# Patient Record
Sex: Female | Born: 1985 | Race: Black or African American | Hispanic: No | Marital: Single | Smoking: Never smoker
Health system: Southern US, Community
[De-identification: ages and names within clinical notes are randomized; demographics above are authoritative.]

## PROBLEM LIST (undated history)

## (undated) DIAGNOSIS — F419 Anxiety disorder, unspecified: Secondary | ICD-10-CM

## (undated) HISTORY — PX: FRACTURE SURGERY: SHX138

---
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
## 2006-01-26 ENCOUNTER — Inpatient Hospital Stay (HOSPITAL_COMMUNITY): Admission: AD | Admit: 2006-01-26 | Discharge: 2006-01-26 | Payer: Self-pay | Admitting: Obstetrics & Gynecology

## 2006-02-07 ENCOUNTER — Inpatient Hospital Stay (HOSPITAL_COMMUNITY): Admission: AD | Admit: 2006-02-07 | Discharge: 2006-02-10 | Payer: Self-pay | Admitting: Obstetrics and Gynecology

## 2006-05-07 ENCOUNTER — Emergency Department (HOSPITAL_COMMUNITY): Admission: EM | Admit: 2006-05-07 | Discharge: 2006-05-08 | Payer: Self-pay | Admitting: Emergency Medicine

## 2006-07-20 ENCOUNTER — Emergency Department (HOSPITAL_COMMUNITY): Admission: EM | Admit: 2006-07-20 | Discharge: 2006-07-20 | Payer: Self-pay | Admitting: Family Medicine

## 2006-11-17 ENCOUNTER — Emergency Department (HOSPITAL_COMMUNITY): Admission: EM | Admit: 2006-11-17 | Discharge: 2006-11-17 | Payer: Self-pay | Admitting: Emergency Medicine

## 2007-04-07 ENCOUNTER — Emergency Department (HOSPITAL_COMMUNITY): Admission: EM | Admit: 2007-04-07 | Discharge: 2007-04-07 | Payer: Self-pay | Admitting: Family Medicine

## 2007-04-17 ENCOUNTER — Emergency Department (HOSPITAL_COMMUNITY): Admission: EM | Admit: 2007-04-17 | Discharge: 2007-04-17 | Payer: Self-pay | Admitting: Family Medicine

## 2007-08-17 ENCOUNTER — Emergency Department (HOSPITAL_COMMUNITY): Admission: EM | Admit: 2007-08-17 | Discharge: 2007-08-17 | Payer: Self-pay | Admitting: Family Medicine

## 2007-10-03 ENCOUNTER — Encounter (INDEPENDENT_AMBULATORY_CARE_PROVIDER_SITE_OTHER): Payer: Self-pay | Admitting: *Deleted

## 2007-10-03 ENCOUNTER — Ambulatory Visit: Payer: Self-pay | Admitting: *Deleted

## 2007-10-13 ENCOUNTER — Emergency Department (HOSPITAL_COMMUNITY): Admission: EM | Admit: 2007-10-13 | Discharge: 2007-10-13 | Payer: Self-pay | Admitting: Emergency Medicine

## 2007-11-03 ENCOUNTER — Inpatient Hospital Stay (HOSPITAL_COMMUNITY): Admission: AD | Admit: 2007-11-03 | Discharge: 2007-11-03 | Payer: Self-pay | Admitting: Obstetrics & Gynecology

## 2007-11-12 ENCOUNTER — Emergency Department (HOSPITAL_COMMUNITY): Admission: EM | Admit: 2007-11-12 | Discharge: 2007-11-12 | Payer: Self-pay | Admitting: Emergency Medicine

## 2008-05-17 ENCOUNTER — Inpatient Hospital Stay (HOSPITAL_COMMUNITY): Admission: AD | Admit: 2008-05-17 | Discharge: 2008-05-17 | Payer: Self-pay | Admitting: Obstetrics & Gynecology

## 2008-07-28 ENCOUNTER — Inpatient Hospital Stay (HOSPITAL_COMMUNITY): Admission: AD | Admit: 2008-07-28 | Discharge: 2008-07-28 | Payer: Self-pay | Admitting: Obstetrics & Gynecology

## 2008-08-03 ENCOUNTER — Emergency Department (HOSPITAL_COMMUNITY): Admission: EM | Admit: 2008-08-03 | Discharge: 2008-08-04 | Payer: Self-pay | Admitting: Emergency Medicine

## 2008-10-05 ENCOUNTER — Emergency Department (HOSPITAL_COMMUNITY): Admission: EM | Admit: 2008-10-05 | Discharge: 2008-10-05 | Payer: Self-pay | Admitting: Family Medicine

## 2008-11-26 ENCOUNTER — Emergency Department (HOSPITAL_COMMUNITY): Admission: EM | Admit: 2008-11-26 | Discharge: 2008-11-26 | Payer: Self-pay | Admitting: Emergency Medicine

## 2009-01-03 ENCOUNTER — Emergency Department (HOSPITAL_COMMUNITY): Admission: EM | Admit: 2009-01-03 | Discharge: 2009-01-03 | Payer: Self-pay | Admitting: Emergency Medicine

## 2009-02-14 ENCOUNTER — Emergency Department (HOSPITAL_COMMUNITY): Admission: EM | Admit: 2009-02-14 | Discharge: 2009-02-14 | Payer: Self-pay | Admitting: Emergency Medicine

## 2009-09-10 ENCOUNTER — Emergency Department (HOSPITAL_COMMUNITY): Admission: EM | Admit: 2009-09-10 | Discharge: 2009-09-10 | Payer: Self-pay | Admitting: Emergency Medicine

## 2009-10-04 ENCOUNTER — Emergency Department (HOSPITAL_COMMUNITY): Admission: EM | Admit: 2009-10-04 | Discharge: 2009-10-04 | Payer: Self-pay | Admitting: Emergency Medicine

## 2009-10-09 ENCOUNTER — Emergency Department (HOSPITAL_COMMUNITY): Admission: EM | Admit: 2009-10-09 | Discharge: 2009-10-09 | Payer: Self-pay | Admitting: Emergency Medicine

## 2009-10-21 ENCOUNTER — Emergency Department (HOSPITAL_COMMUNITY): Admission: EM | Admit: 2009-10-21 | Discharge: 2009-10-21 | Payer: Self-pay | Admitting: Emergency Medicine

## 2010-02-15 ENCOUNTER — Emergency Department (HOSPITAL_COMMUNITY): Admission: EM | Admit: 2010-02-15 | Discharge: 2010-02-16 | Payer: Self-pay | Admitting: Emergency Medicine

## 2010-12-02 IMAGING — CR DG KNEE COMPLETE 4+V*L*
4 series · 4 of 4 positions shown · non-contrast
Comparison: None

CLINICAL DATA: Trauma

LEFT KNEE - COMPLETE 4+ VIEW

[t knee ap left]
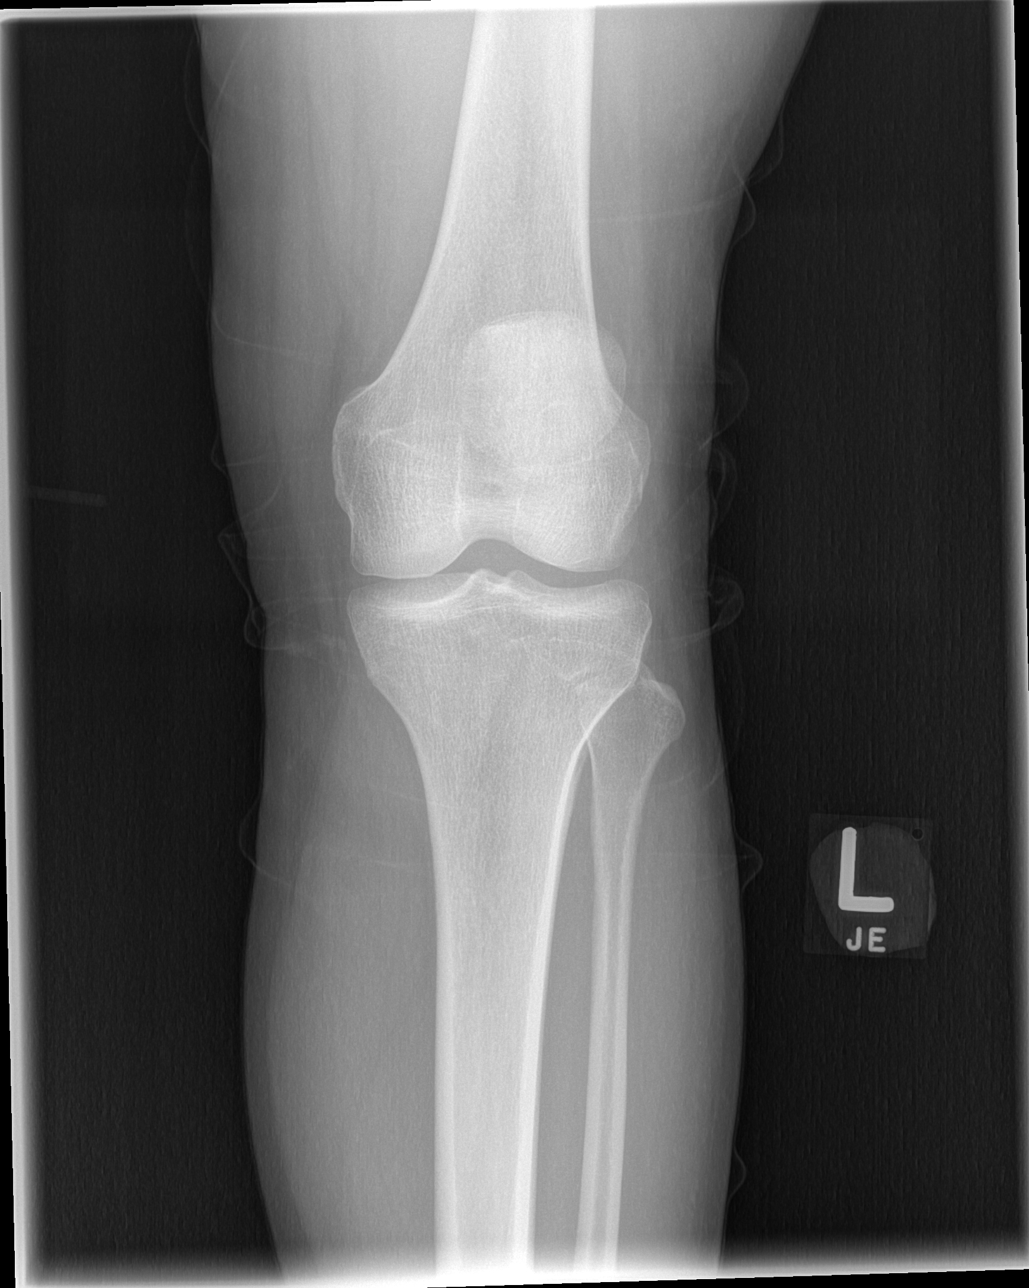

[t knee oblique left (1 of 2)]
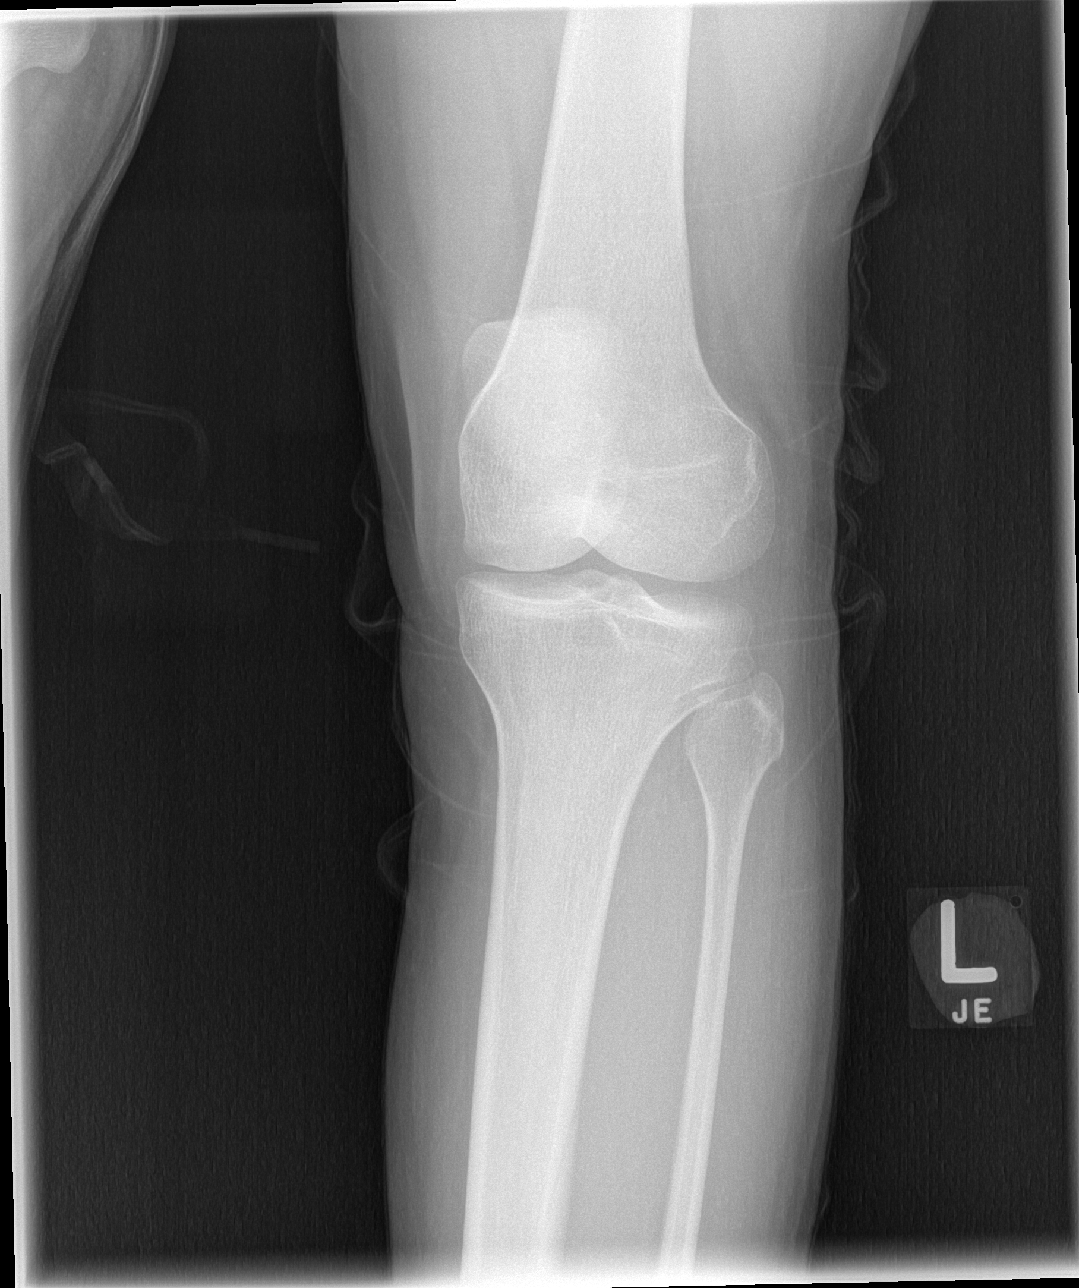

[t knee oblique left (2 of 2)]
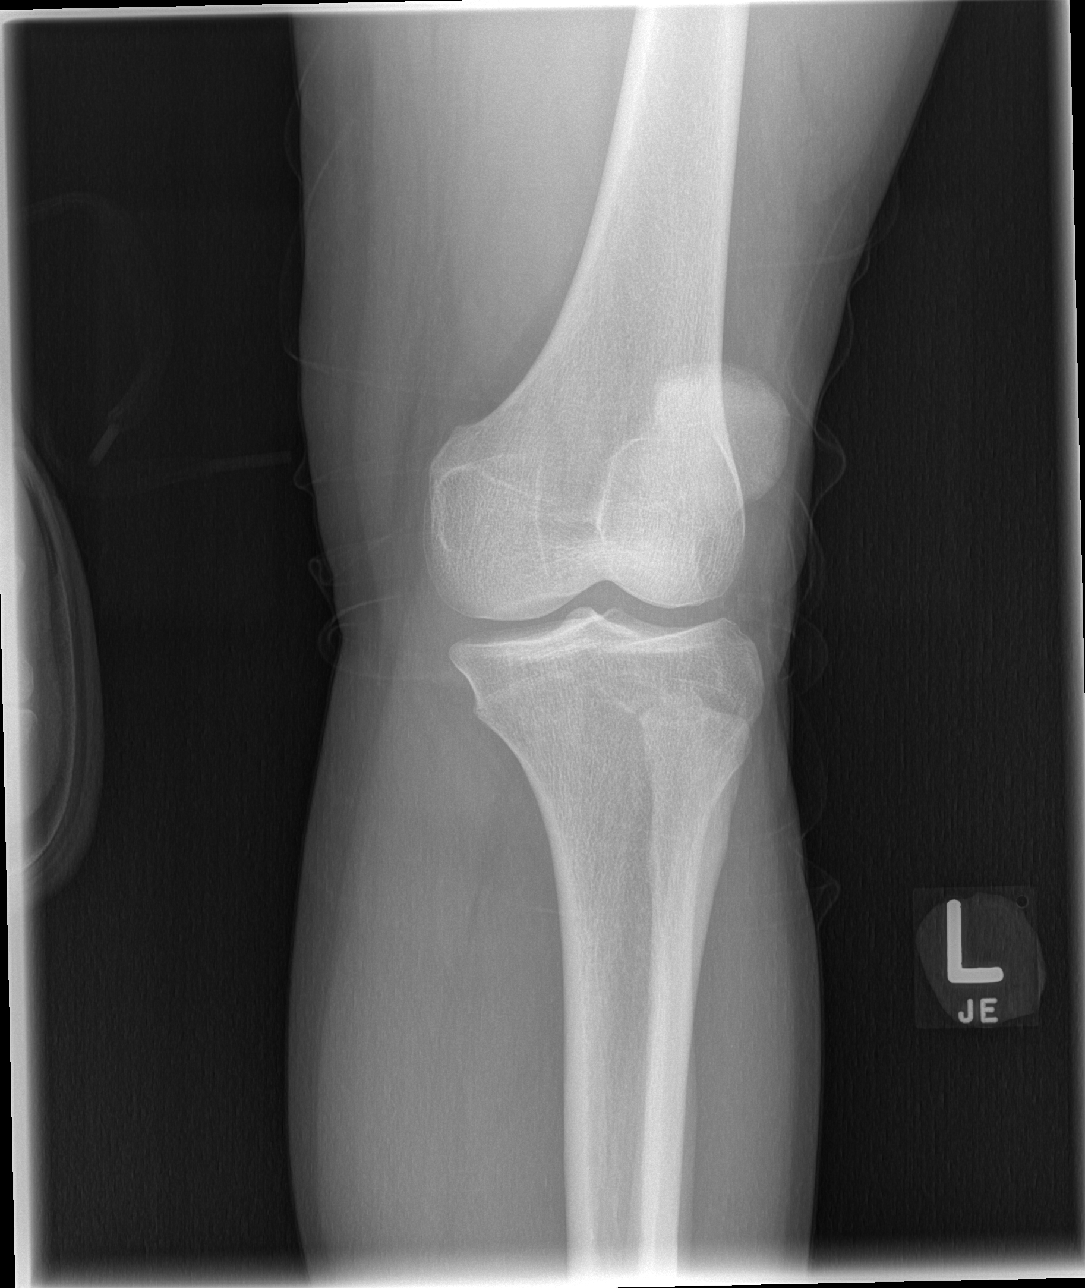

[t knee lat left]
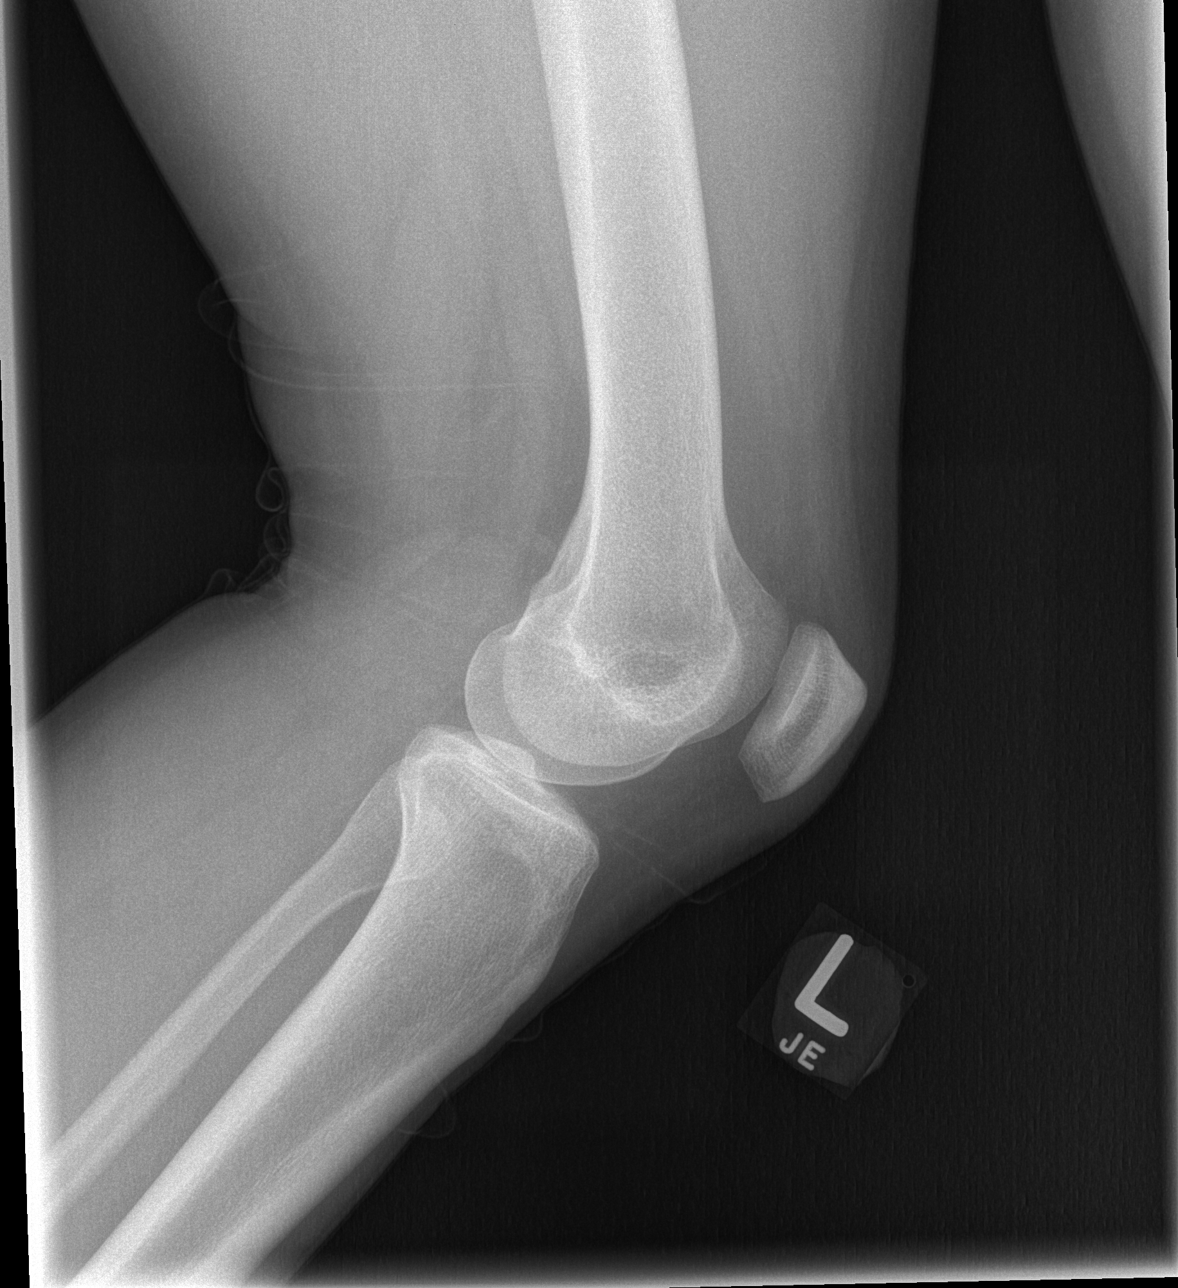

[4 of 4 positions shown; findings below may reference images not displayed]

FINDINGS: Anatomic alignment.  No joint effusion.  No fracture.
IMPRESSION: Negative for fracture.

## 2011-02-11 LAB — URINALYSIS, ROUTINE W REFLEX MICROSCOPIC
Hgb urine dipstick: NEGATIVE
Nitrite: NEGATIVE
Protein, ur: NEGATIVE mg/dL

## 2011-02-11 LAB — GC/CHLAMYDIA PROBE AMP, GENITAL: Chlamydia, DNA Probe: NEGATIVE

## 2011-02-11 LAB — POCT I-STAT, CHEM 8
Calcium, Ion: 1.24 mmol/L (ref 1.12–1.32)
Chloride: 106 mEq/L (ref 96–112)
Potassium: 4.2 mEq/L (ref 3.5–5.1)
Sodium: 139 mEq/L (ref 135–145)

## 2011-02-11 LAB — WET PREP, GENITAL: Yeast Wet Prep HPF POC: NONE SEEN

## 2011-02-21 LAB — URINALYSIS, ROUTINE W REFLEX MICROSCOPIC
Bilirubin Urine: NEGATIVE
Glucose, UA: NEGATIVE mg/dL
Specific Gravity, Urine: 1.025 (ref 1.005–1.030)

## 2011-02-21 LAB — URINE MICROSCOPIC-ADD ON

## 2011-02-22 LAB — WET PREP, GENITAL

## 2011-02-22 LAB — URINALYSIS, ROUTINE W REFLEX MICROSCOPIC
Glucose, UA: NEGATIVE mg/dL
Ketones, ur: NEGATIVE mg/dL
Nitrite: NEGATIVE

## 2011-02-22 LAB — URINE CULTURE: Colony Count: 60000

## 2011-04-03 NOTE — Group Therapy Note (Signed)
Christina Gamble, Christina Gamble               ACCOUNT NO.:  [id number]   MEDICAL RECORD NO.:  [id number]          PATIENT TYPE:  WOC   LOCATION:  [facility]                   FACILITY:  [facility]   PHYSICIAN:  Karlton Lemon, MD      DATE OF BIRTH:  09-18-1986   DATE OF SERVICE:  10/03/2007                                  CLINIC NOTE   CHIEF COMPLAINT:  Abnormal Pap smear.   HISTORY OF PRESENT ILLNESS:  This is a 25 year old female with a history  of abnormal Pap smear and colposcopy x1.  Her initial colposcopy was  done for a ASC-H Pap smear.  Since her colposcopy, she has had 2 Pap  smears, one being ASCUS, and one being LSIL.  She was referred today for  colposcopy.  The patient states she also feels like she is having some  discomfort when urinating but is otherwise doing well.   PAST MEDICAL HISTORY:  Abnormal Pap smears as stated above.   MEDICATIONS:  Multivitamin, Tylenol p.r.n.   PHYSICAL EXAMINATION:  GENERAL:  Well-appearing female in no distress.  VITAL SIGNS:  Temperature 98.1, respirations 16, blood pressure 83/56.  GENITOURINARY:  Normal external female genitalia.  Vaginal mucosa is  pink and moist.  There is a small amount of whitish discharge.  A Pap  smear is performed.  The cervix is visualized as well.  Bimanual was  performed without cervical motion tenderness.  Uterus is midline, normal  size.  Ovaries are not palpated today.   IMPRESSION AND PLAN:  A 25 year old [place] American female with  abnormal Pap smears.  The patient was discussed with Dr. Okey Dupre and will  not perform colposcopy today due to low grade squamous lesion at age 25.  The chance of this continuing to improve from her previous history of  favor high grade squamous epithelium is possible.  We will perform a Pap  smear today including gonorrhea and chlamydia.  Urinalysis today was  negative.  We will follow in 6 months for repeat Pap smear, unless Pap  smear today shows high grade abnormality.     ______________________________  Karlton Lemon, MD     NS/MEDQ  D:  10/03/2007  T:  10/03/2007  Job:  [id number]

## 2011-08-16 LAB — URINE MICROSCOPIC-ADD ON

## 2011-08-16 LAB — CBC
MCHC: 32.5
MCV: 72.4 — ABNORMAL LOW
Platelets: 335
RDW: 16.1 — ABNORMAL HIGH
WBC: 6.2

## 2011-08-16 LAB — URINALYSIS, ROUTINE W REFLEX MICROSCOPIC
Nitrite: NEGATIVE
Specific Gravity, Urine: >1.03 — ABNORMAL HIGH
Urobilinogen, UA: 0.2

## 2011-08-16 LAB — POCT PREGNANCY, URINE: Operator id: 181461

## 2011-08-16 LAB — WET PREP, GENITAL
Trich, Wet Prep: NONE SEEN
Yeast Wet Prep HPF POC: NONE SEEN

## 2011-08-16 LAB — GC/CHLAMYDIA PROBE AMP, GENITAL: Chlamydia, DNA Probe: NEGATIVE

## 2011-08-21 LAB — POCT URINALYSIS DIP (DEVICE)
Nitrite: POSITIVE — AB
Protein, ur: 100 mg/dL — AB
Urobilinogen, UA: 1 mg/dL (ref 0.0–1.0)

## 2011-08-21 LAB — WET PREP, GENITAL: Yeast Wet Prep HPF POC: NONE SEEN

## 2011-08-21 LAB — POCT PREGNANCY, URINE: Preg Test, Ur: NEGATIVE

## 2011-08-22 LAB — URINALYSIS, ROUTINE W REFLEX MICROSCOPIC
Glucose, UA: NEGATIVE
Hgb urine dipstick: NEGATIVE
Specific Gravity, Urine: 1.02

## 2011-08-22 LAB — HCG, QUANTITATIVE, PREGNANCY: hCG, Beta Chain, Quant, S: 72105 — ABNORMAL HIGH

## 2011-08-22 LAB — CBC
HCT: 36.1
MCV: 72.6 — ABNORMAL LOW
RBC: 4.98
WBC: 9.6

## 2011-08-22 LAB — GC/CHLAMYDIA PROBE AMP, GENITAL: Chlamydia, DNA Probe: NEGATIVE

## 2011-08-22 LAB — WET PREP, GENITAL
Trich, Wet Prep: NONE SEEN
Yeast Wet Prep HPF POC: NONE SEEN

## 2011-08-24 LAB — GC/CHLAMYDIA PROBE AMP, GENITAL
Chlamydia, DNA Probe: NEGATIVE
GC Probe Amp, Genital: NEGATIVE

## 2011-08-24 LAB — WET PREP, GENITAL: Trich, Wet Prep: NONE SEEN

## 2011-08-24 LAB — RAPID STREP SCREEN (MED CTR MEBANE ONLY): Streptococcus, Group A Screen (Direct): NEGATIVE

## 2011-08-24 LAB — POCT PREGNANCY, URINE: Preg Test, Ur: NEGATIVE

## 2011-08-28 LAB — POCT URINALYSIS DIP (DEVICE)
Bilirubin Urine: NEGATIVE
Ketones, ur: NEGATIVE
Operator id: 297281
Protein, ur: NEGATIVE
Specific Gravity, Urine: 1.02

## 2011-08-28 LAB — STREP A DNA PROBE

## 2011-08-28 LAB — POCT PREGNANCY, URINE
Operator id: 297281
Preg Test, Ur: NEGATIVE

## 2011-08-30 LAB — POCT PREGNANCY, URINE
Operator id: 116391
Preg Test, Ur: NEGATIVE

## 2014-10-30 ENCOUNTER — Emergency Department (HOSPITAL_BASED_OUTPATIENT_CLINIC_OR_DEPARTMENT_OTHER)
Admission: EM | Admit: 2014-10-30 | Discharge: 2014-10-30 | Disposition: A | Discharge status: Home / Self Care | Payer: Self-pay | Attending: Emergency Medicine | Admitting: Emergency Medicine

## 2014-10-30 ENCOUNTER — Encounter (HOSPITAL_BASED_OUTPATIENT_CLINIC_OR_DEPARTMENT_OTHER): Payer: Self-pay | Admitting: *Deleted

## 2014-10-30 ENCOUNTER — Emergency Department (HOSPITAL_BASED_OUTPATIENT_CLINIC_OR_DEPARTMENT_OTHER): Payer: Self-pay

## 2014-10-30 DIAGNOSIS — M940 Chondrocostal junction syndrome [Tietze]: Secondary | ICD-10-CM | POA: Insufficient documentation

## 2014-10-30 DIAGNOSIS — R0981 Nasal congestion: Secondary | ICD-10-CM | POA: Insufficient documentation

## 2014-10-30 DIAGNOSIS — R079 Chest pain, unspecified: Secondary | ICD-10-CM | POA: Insufficient documentation

## 2014-10-30 MED ORDER — NAPROXEN 500 MG PO TABS: 500.0000 mg | ORAL_TABLET | Freq: Two times a day (BID) | ORAL | Status: DC

## 2014-10-30 NOTE — ED Notes (Signed)
Pt reports SOB and generalized weakness over the last week worse today. Also reports burning sensation in chest and nasal congestion but denies cough or sore throat.

## 2014-10-30 NOTE — ED Notes (Signed)
Patient states she has central chest pain and shortness of breath. States the pain started last week. Patient texting during triage.

## 2014-10-30 NOTE — ED Provider Notes (Signed)
CSN: [id number]     Arrival date & time 10/30/14  1839 History   First MD Initiated Contact with Patient 10/30/14 1921     Chief Complaint  Patient presents with  . Shortness of Breath     (Consider location/radiation/quality/duration/timing/severity/associated sxs/prior Treatment) HPI Comments: 28 year old healthy female presenting with chest pain 1 week. Patient describes the pain as constant, radiating across her chest. No aggravating or alleviating factors. Pain slightly worse today. Admits to mild shortness of breath and a burning sensation in her chest with associated nasal congestion. She has been taking over-the-counter TheraFlu with minimal relief. States she started to feel fatigued, weak and achy. She endorses chills. Unknown fever. No nausea or vomiting. Denies cough, sore throat or wheezing. Nonsmoker. Denies family history of early heart disease.  Patient is a 28 y.o. female presenting with shortness of breath. The history is provided by the patient.  Shortness of Breath   History reviewed. No pertinent past medical history. History reviewed. No pertinent past surgical history. No family history on file. History  Substance Use Topics  . Smoking status: Never Smoker   . Smokeless tobacco: Not on file  . Alcohol Use: No   OB History    No data available     Review of Systems  10 Systems reviewed and are negative for acute change except as noted in the HPI.  Allergies  Review of patient's allergies indicates no known allergies.  Home Medications   Prior to Admission medications   Medication Sig Start Date End Date Taking? Authorizing Provider  naproxen (NAPROSYN) 500 MG tablet Take 1 tablet (500 mg total) by mouth 2 (two) times daily. 10/30/14   Zuleyma Scharf M Susumu Hackler, PA-C   BP 109/62 mmHg  Pulse 94  Temp(Src) 98.9 F (37.2 C) (Oral)  Resp 18  Ht 4\' 11"  (1.499 m)  Wt 120 lb (54.432 kg)  BMI 24.22 kg/m2  SpO2 100% Physical Exam  Constitutional: She is oriented  to person, place, and time. She appears well-developed and well-nourished. No distress.  HENT:  Head: Normocephalic and atraumatic.  Mouth/Throat: Oropharynx is clear and moist.  Eyes: Conjunctivae and EOM are normal. Pupils are equal, round, and reactive to light.  Neck: Normal range of motion. Neck supple. No JVD present.  Cardiovascular: Normal rate, regular rhythm, normal heart sounds and intact distal pulses.   No extremity edema.  Pulmonary/Chest: Effort normal and breath sounds normal. No respiratory distress. She exhibits tenderness (mild tenderness across chest).  Abdominal: Soft. Bowel sounds are normal. There is no tenderness.  Musculoskeletal: Normal range of motion. She exhibits no edema.  Neurological: She is alert and oriented to person, place, and time. She has normal strength. No sensory deficit.  Speech fluent, goal oriented. Moves limbs without ataxia. Equal grip strength bilateral.  Skin: Skin is warm and dry. No rash noted. She is not diaphoretic.  Psychiatric: She has a normal mood and affect. Her behavior is normal.  Nursing note and vitals reviewed.   ED Course  Procedures (including critical care time) Labs Review Labs Reviewed - No data to display  Imaging Review Dg Chest 2 View  10/30/2014   CLINICAL DATA:  Shortness of breath and weakness  EXAM: CHEST  2 VIEW  COMPARISON:  None.  FINDINGS: The heart size and mediastinal contours are within normal limits. Both lungs are clear. The visualized skeletal structures are unremarkable.  IMPRESSION: No active cardiopulmonary disease.   Electronically Signed   By: Eulah PontMark  Lukens M.D.  On: 10/30/2014 20:15     EKG Interpretation None      MDM   Final diagnoses:  Chest pain  Costochondritis   Patient in no apparent distress. Vital signs stable. Viral type symptoms present. Afebrile. I do not feel cardiac workup is necessary, low risk. Doubt PE, PERC negative. EKG without any acute findings. Chest x-ray  negative. Patient was looking down on her phone throughout most of this encounter, along with immediately requesting the TV put back on that was shut off while laughing asking to be turned back on. Discussed symptomatic treatment. She is stable for discharge. Return precautions given. Patient states understanding of treatment care plan and is agreeable.  Kathrynn SpeedRobyn M Jeyla Bulger, PA-C 10/30/14 2029  Warnell Foresterrey Wofford, MD 10/31/14 [phone]

## 2014-10-30 NOTE — Discharge Instructions (Signed)
Naproxen as prescribed as needed for pain. You may apply an ice pack or heating pad across her chest. Rest and stay well-hydrated.  Costochondritis Costochondritis, sometimes called Tietze syndrome, is a swelling and irritation (inflammation) of the tissue (cartilage) that connects your ribs with your breastbone (sternum). It causes pain in the chest and rib area. Costochondritis usually goes away on its own over time. It can take up to 6 weeks or longer to get better, especially if you are unable to limit your activities. CAUSES  Some cases of costochondritis have no known cause. Possible causes include:  Injury (trauma).  Exercise or activity such as lifting.  Severe coughing. SIGNS AND SYMPTOMS  Pain and tenderness in the chest and rib area.  Pain that gets worse when coughing or taking deep breaths.  Pain that gets worse with specific movements. DIAGNOSIS  Your health care provider will do a physical exam and ask about your symptoms. Chest X-rays or other tests may be done to rule out other problems. TREATMENT  Costochondritis usually goes away on its own over time. Your health care provider may prescribe medicine to help relieve pain. HOME CARE INSTRUCTIONS   Avoid exhausting physical activity. Try not to strain your ribs during normal activity. This would include any activities using chest, abdominal, and side muscles, especially if heavy weights are used.  Apply ice to the affected area for the first 2 days after the pain begins.  Put ice in a plastic bag.  Place a towel between your skin and the bag.  Leave the ice on for 20 minutes, 2-3 times a day.  Only take over-the-counter or prescription medicines as directed by your health care provider. SEEK MEDICAL CARE IF:  You have redness or swelling at the rib joints. These are signs of infection.  Your pain does not go away despite rest or medicine. SEEK IMMEDIATE MEDICAL CARE IF:   Your pain increases or you are very  uncomfortable.  You have shortness of breath or difficulty breathing.  You cough up blood.  You have worse chest pains, sweating, or vomiting.  You have a fever or persistent symptoms for more than 2-3 days.  You have a fever and your symptoms suddenly get worse. MAKE SURE YOU:   Understand these instructions.  Will watch your condition.  Will get help right away if you are not doing well or get worse. Document Released: 08/15/2005 Document Revised: 08/26/2013 Document Reviewed: 06/09/2013 [facility] Patient Information 2015 [place], [place]. This information is not intended to replace advice given to you by your health care provider. Make sure you discuss any questions you have with your health care provider.  Chest Pain (Nonspecific) It is often hard to give a specific diagnosis for the cause of chest pain. There is always a chance that your pain could be related to something serious, such as a heart attack or a blood clot in the lungs. You need to follow up with your health care provider for further evaluation. CAUSES   Heartburn.  Pneumonia or bronchitis.  Anxiety or stress.  Inflammation around your heart (pericarditis) or lung (pleuritis or pleurisy).  A blood clot in the lung.  A collapsed lung (pneumothorax). It can develop suddenly on its own (spontaneous pneumothorax) or from trauma to the chest.  Shingles infection (herpes zoster virus). The chest wall is composed of bones, muscles, and cartilage. Any of these can be the source of the pain.  The bones can be bruised by injury.  The muscles  or cartilage can be strained by coughing or overwork.  The cartilage can be affected by inflammation and become sore (costochondritis). DIAGNOSIS  Lab tests or other studies may be needed to find the cause of your pain. Your health care provider may have you take a test called an ambulatory electrocardiogram (ECG). An ECG records your heartbeat patterns over a 24-hour period.  You may also have other tests, such as:  Transthoracic echocardiogram (TTE). During echocardiography, sound waves are used to evaluate how blood flows through your heart.  Transesophageal echocardiogram (TEE).  Cardiac monitoring. This allows your health care provider to monitor your heart rate and rhythm in real time.  Holter monitor. This is a portable device that records your heartbeat and can help diagnose heart arrhythmias. It allows your health care provider to track your heart activity for several days, if needed.  Stress tests by exercise or by giving medicine that makes the heart beat faster. TREATMENT   Treatment depends on what may be causing your chest pain. Treatment may include:  Acid blockers for heartburn.  Anti-inflammatory medicine.  Pain medicine for inflammatory conditions.  Antibiotics if an infection is present.  You may be advised to change lifestyle habits. This includes stopping smoking and avoiding alcohol, caffeine, and chocolate.  You may be advised to keep your head raised (elevated) when sleeping. This reduces the chance of acid going backward from your stomach into your esophagus. Most of the time, nonspecific chest pain will improve within 2-3 days with rest and mild pain medicine.  HOME CARE INSTRUCTIONS   If antibiotics were prescribed, take them as directed. Finish them even if you start to feel better.  For the next few days, avoid physical activities that bring on chest pain. Continue physical activities as directed.  Do not use any tobacco products, including cigarettes, chewing tobacco, or electronic cigarettes.  Avoid drinking alcohol.  Only take medicine as directed by your health care provider.  Follow your health care provider's suggestions for further testing if your chest pain does not go away.  Keep any follow-up appointments you made. If you do not go to an appointment, you could develop lasting (chronic) problems with pain. If  there is any problem keeping an appointment, call to reschedule. SEEK MEDICAL CARE IF:   Your chest pain does not go away, even after treatment.  You have a rash with blisters on your chest.  You have a fever. SEEK IMMEDIATE MEDICAL CARE IF:   You have increased chest pain or pain that spreads to your arm, neck, jaw, back, or abdomen.  You have shortness of breath.  You have an increasing cough, or you cough up blood.  You have severe back or abdominal pain.  You feel nauseous or vomit.  You have severe weakness.  You faint.  You have chills. This is an emergency. Do not wait to see if the pain will go away. Get medical help at once. Call your local emergency services (911 in [place] Do not drive yourself to the hospital. MAKE SURE YOU:   Understand these instructions.  Will watch your condition.  Will get help right away if you are not doing well or get worse. Document Released: 08/15/2005 Document Revised: 11/10/2013 Document Reviewed: 06/10/2008 [facility] Patient Information 2015 [place]Care, [place]LL[place]. This information is not intended to replace advice given to you by your health care provider. Make sure you discuss any questions you have with your health care provider.

## 2014-11-27 ENCOUNTER — Emergency Department (HOSPITAL_BASED_OUTPATIENT_CLINIC_OR_DEPARTMENT_OTHER)
Admission: EM | Admit: 2014-11-27 | Discharge: 2014-11-27 | Disposition: A | Discharge status: Home / Self Care | Payer: Self-pay | Attending: Emergency Medicine | Admitting: Emergency Medicine

## 2014-11-27 ENCOUNTER — Emergency Department (HOSPITAL_BASED_OUTPATIENT_CLINIC_OR_DEPARTMENT_OTHER): Payer: Self-pay

## 2014-11-27 ENCOUNTER — Encounter (HOSPITAL_BASED_OUTPATIENT_CLINIC_OR_DEPARTMENT_OTHER): Payer: Self-pay | Admitting: Emergency Medicine

## 2014-11-27 DIAGNOSIS — Z23 Encounter for immunization: Secondary | ICD-10-CM | POA: Insufficient documentation

## 2014-11-27 DIAGNOSIS — S61211A Laceration without foreign body of left index finger without damage to nail, initial encounter: Secondary | ICD-10-CM | POA: Insufficient documentation

## 2014-11-27 DIAGNOSIS — W272XXA Contact with scissors, initial encounter: Secondary | ICD-10-CM | POA: Insufficient documentation

## 2014-11-27 DIAGNOSIS — Y9289 Other specified places as the place of occurrence of the external cause: Secondary | ICD-10-CM | POA: Insufficient documentation

## 2014-11-27 DIAGNOSIS — Z791 Long term (current) use of non-steroidal anti-inflammatories (NSAID): Secondary | ICD-10-CM | POA: Insufficient documentation

## 2014-11-27 DIAGNOSIS — Y9389 Activity, other specified: Secondary | ICD-10-CM | POA: Insufficient documentation

## 2014-11-27 DIAGNOSIS — Y998 Other external cause status: Secondary | ICD-10-CM | POA: Insufficient documentation

## 2014-11-27 DIAGNOSIS — S61219A Laceration without foreign body of unspecified finger without damage to nail, initial encounter: Secondary | ICD-10-CM

## 2014-11-27 MED ORDER — TETANUS-DIPHTH-ACELL PERTUSSIS 5-2.5-18.5 LF-MCG/0.5 IM SUSP
0.5000 mL | Freq: Once | INTRAMUSCULAR | Status: AC
  Administered 2014-11-27: 0.5 mL via INTRAMUSCULAR
  Filled 2014-11-27: qty 0.5

## 2014-11-27 NOTE — ED Notes (Signed)
Pt presents to ED with complaints of lac to index finger on left hand with scissors.

## 2014-11-27 NOTE — ED Provider Notes (Signed)
CSN: [id number]     Arrival date & time 11/27/14  1830 History   First MD Initiated Contact with Patient 11/27/14 1912     Chief Complaint  Patient presents with  . Laceration     (Consider location/radiation/quality/duration/timing/severity/associated sxs/prior Treatment) HPI   Christina Gamble is a 29 y.o. female complaining of laceration to left index finger after she cut herself with scissors just prior to arrival. Patient is unsure when her last tetanus shot was, bleeding is controlled. Patient states pain is minimal, 2 out of 10 and exacerbated by movement and palpation. Patient is right-hand-dominant. She denies numbness, weakness, decreased range of motion.  History reviewed. No pertinent past medical history. History reviewed. No pertinent past surgical history. No family history on file. History  Substance Use Topics  . Smoking status: Never Smoker   . Smokeless tobacco: Not on file  . Alcohol Use: No   OB History    No data available     Review of Systems  10 systems reviewed and found to be negative, except as noted in the HPI.  Allergies  Review of patient's allergies indicates no known allergies.  Home Medications   Prior to Admission medications   Medication Sig Start Date End Date Taking? Authorizing Provider  naproxen (NAPROSYN) 500 MG tablet Take 1 tablet (500 mg total) by mouth 2 (two) times daily. 10/30/14   Robyn M Hess, PA-C   BP 113/53 mmHg  Pulse 78  Temp(Src) 98.7 F (37.1 C) (Oral)  Resp 18  Wt 120 lb (54.432 kg)  SpO2 100%  LMP 11/27/2014 Physical Exam  Skin:  1 cm irregular full and partial-thickness laceration to distal tip of volar side of left pointer finger, full range of motion to DIP in flexion and extension.    ED Course  LACERATION REPAIR Date/Time: 11/27/2014 7:40 PM Performed by: Wynetta EmeryPISCIOTTA, Erland Vivas Authorized by: Wynetta EmeryPISCIOTTA, [employer] Consent: Verbal consent obtained. Risks and benefits: risks, benefits and alternatives were  discussed Consent given by: patient Required items: required blood products, implants, devices, and special equipment available Patient identity confirmed: verbally with patient Time out: Immediately prior to procedure a "time out" was called to verify the correct patient, procedure, equipment, support staff and site/side marked as required. Body area: upper extremity Location details: left index finger Laceration length: 1 cm Foreign bodies: no foreign bodies Vascular damage: no Patient sedated: no Preparation: Patient was prepped and draped in the usual sterile fashion. Irrigation solution: saline Irrigation method: syringe Amount of cleaning: extensive Debridement: moderate Degree of undermining: none Skin closure: glue Approximation: close Approximation difficulty: complex Patient tolerance: Patient tolerated the procedure well with no immediate complications   (including critical care time) Labs Review Labs Reviewed - No data to display  Imaging Review No results found.   EKG Interpretation None      MDM   Final diagnoses:  Rash and nonspecific skin eruption  Cyst of right Bartholin's gland    Filed Vitals:   11/27/14 1843 11/27/14 2008  BP: 113/53 100/52  Pulse: 78 84  Temp: 98.7 F (37.1 C)   TempSrc: Oral   Resp: 18 18  Weight: 120 lb (54.432 kg)   SpO2: 100% 97%    Medications  Tdap (BOOSTRIX) injection 0.5 mL (0.5 mLs Intramuscular Given 11/27/14 1930)    Christina Gamble is a pleasant 29 y.o. female presenting with finger laceration.  No signs of tendon/joint involvement. Tdap booster given. Pressure irrigation performed. Laceration occurred < 8 hours prior to repair  which was well tolerated. Pt has no co morbidities to effect normal wound healing. Discussed suture home care w pt and answered questions. Pt to f-u for wound check and suture removal in 7 days. Pt is hemodynamically stable with no complaints prior to dc.   Evaluation does not show  pathology that would require ongoing emergent intervention or inpatient treatment. Pt is hemodynamically stable and mentating appropriately. Discussed findings and plan with patient/guardian, who agrees with care plan. All questions answered. Return precautions discussed and outpatient follow up given.    Wynetta Emery, PA-C 11/27/14 [id number]  Gilda Crease, MD 11/28/14 [phone]

## 2014-11-27 NOTE — Discharge Instructions (Signed)
°  Keep wound dry and do not remove dressing for 24 hours if possible. After that, wash gently morning and night (every 12 hours) with soap and water.    Do NOT use rubbing alcohol or hydrogen peroxide, do not soak the area   Every attempt was made to remove foreign body (contaminants) from the wound.  However, there is always a chance that some may remain in the wound. This can  increase your risk of infection.   If you see signs of infection (warmth, redness, tenderness, pus, sharp increase in pain, fever, red streaking in the skin) immediately return to the emergency department.   After the wound heals fully, apply sunscreen for 6-12 months to minimize scarring.

## 2015-05-11 ENCOUNTER — Encounter (HOSPITAL_BASED_OUTPATIENT_CLINIC_OR_DEPARTMENT_OTHER): Payer: Self-pay | Admitting: Emergency Medicine

## 2015-05-11 ENCOUNTER — Emergency Department (HOSPITAL_BASED_OUTPATIENT_CLINIC_OR_DEPARTMENT_OTHER)
Admission: EM | Admit: 2015-05-11 | Discharge: 2015-05-11 | Disposition: A | Discharge status: Home / Self Care | Payer: Medicaid Other | Attending: Emergency Medicine | Admitting: Emergency Medicine

## 2015-05-11 DIAGNOSIS — L739 Follicular disorder, unspecified: Secondary | ICD-10-CM | POA: Insufficient documentation

## 2015-05-11 DIAGNOSIS — Z791 Long term (current) use of non-steroidal anti-inflammatories (NSAID): Secondary | ICD-10-CM | POA: Insufficient documentation

## 2015-05-11 NOTE — ED Notes (Addendum)
29 yo with c/o vaginal irritation with raised bump. States its a irritated hair bump, since yesterday. Denies d/c or bleeding.

## 2015-05-11 NOTE — ED Provider Notes (Signed)
CSN: [id number]     Arrival date & time 05/11/15  2022 History  This chart was scribed for Rolan Bucco, MD by Phillis Haggis, ED Scribe. This patient was seen in room MH04/MH04 and patient care was started at 9:33 PM.    Chief Complaint  Patient presents with  . Groin Swelling   The history is provided by the patient. No language interpreter was used.    HPI Comments: Christina Gamble is a 29 y.o. female who presents to the Emergency Department complaining of vaginal irritation onset one day ago. She states that she has a non-painful bump in her groin region with some swelling. She states that she had an appointment recently where she got tested for HIV/Aids and other STDs and all was negative. She states that she is sexually active with the same partner; states that she had sex a week ago and may have had a tear. She denies abdominal pain, fever, chills, urinary symptoms, vaginal bleeding or discharge. She is worried about Herpes infection.  History reviewed. No pertinent past medical history. Past Surgical History  Procedure Laterality Date  . Fracture surgery     No family history on file. History  Substance Use Topics  . Smoking status: Never Smoker   . Smokeless tobacco: Not on file  . Alcohol Use: Yes   OB History    No data available     Review of Systems  Constitutional: Negative for fever, chills, diaphoresis and fatigue.  HENT: Negative for congestion, rhinorrhea and sneezing.   Eyes: Negative.   Respiratory: Negative for cough, chest tightness and shortness of breath.   Cardiovascular: Negative for chest pain and leg swelling.  Gastrointestinal: Negative for nausea, vomiting, abdominal pain, diarrhea and blood in stool.  Genitourinary: Negative for dysuria, frequency, hematuria, flank pain, vaginal bleeding, vaginal discharge, difficulty urinating and vaginal pain.  Musculoskeletal: Negative for back pain and arthralgias.  Skin: Negative for rash.       Raised bump  near vagina  Neurological: Negative for dizziness, speech difficulty, weakness, numbness and headaches.   Allergies  Review of patient's allergies indicates no known allergies.  Home Medications   Prior to Admission medications   Medication Sig Start Date End Date Taking? Authorizing Provider  naproxen (NAPROSYN) 500 MG tablet Take 1 tablet (500 mg total) by mouth 2 (two) times daily. 10/30/14   Robyn M Hess, PA-C   BP 90/57 mmHg  Pulse 66  Temp(Src) 98.5 F (36.9 C) (Oral)  Resp 18  Ht 4\' 11"  (1.499 m)  Wt 118 lb (53.524 kg)  BMI 23.82 kg/m2  SpO2 99%  LMP 04/27/2015 Physical Exam  Constitutional: She is oriented to person, place, and time. She appears well-developed and well-nourished.  HENT:  Head: Normocephalic and atraumatic.  Eyes: Pupils are equal, round, and reactive to light.  Neck: Normal range of motion. Neck supple.  Cardiovascular: Normal rate, regular rhythm and normal heart sounds.   Pulmonary/Chest: Effort normal and breath sounds normal. No respiratory distress. She has no wheezes. She has no rales. She exhibits no tenderness.  Abdominal: Soft. Bowel sounds are normal. There is no tenderness. There is no rebound and no guarding.  Genitourinary:  On exam of the labia, patient has a small pair very slightly enlarged hair follicle. There is no tenderness. No drainage. No surrounding redness. No ulcerated lesions. No vesicles.  Musculoskeletal: Normal range of motion. She exhibits no edema.  Lymphadenopathy:    She has no cervical adenopathy.  Neurological: She  is alert and oriented to person, place, and time.  Skin: Skin is warm and dry. No rash noted.  Psychiatric: She has a normal mood and affect.    ED Course  Procedures (including critical care time) DIAGNOSTIC STUDIES: Oxygen Saturation is 99% on RA, normal by my interpretation.    COORDINATION OF CARE: 9:36 PM-Discussed treatment plan which includes follow up if new pain or worsening ulcerations  appear with pt at bedside and pt agreed to plan.   Labs Review Labs Reviewed - No data to display  Imaging Review No results found.   EKG Interpretation None      MDM   Final diagnoses:  Folliculitis    There is a very small what appears to be a hair follicle. I don't see any evidence of herpes infection. I don't see any evidence of bacterial infection. She states she had a recent pelvic exam last week and does not feel that she needs to be tested for any other STDs. I advised her that this does not appear to be a herpes infection. I advised her to return if she has worsening symptoms or develops a painful or ulcerated area.  I personally performed the services described in this documentation, which was scribed in my presence.  The recorded information has been reviewed and considered.    Rolan Bucco, MD 05/11/15 2151

## 2015-05-11 NOTE — Discharge Instructions (Signed)

## 2015-06-06 ENCOUNTER — Emergency Department (HOSPITAL_BASED_OUTPATIENT_CLINIC_OR_DEPARTMENT_OTHER)
Admission: EM | Admit: 2015-06-06 | Discharge: 2015-06-06 | Disposition: A | Discharge status: Home / Self Care | Payer: Medicaid Other | Attending: Emergency Medicine | Admitting: Emergency Medicine

## 2015-06-06 DIAGNOSIS — Z791 Long term (current) use of non-steroidal anti-inflammatories (NSAID): Secondary | ICD-10-CM | POA: Insufficient documentation

## 2015-06-06 DIAGNOSIS — R112 Nausea with vomiting, unspecified: Secondary | ICD-10-CM | POA: Insufficient documentation

## 2015-06-06 DIAGNOSIS — Z3202 Encounter for pregnancy test, result negative: Secondary | ICD-10-CM | POA: Insufficient documentation

## 2015-06-06 DIAGNOSIS — L299 Pruritus, unspecified: Secondary | ICD-10-CM | POA: Insufficient documentation

## 2015-06-06 DIAGNOSIS — R197 Diarrhea, unspecified: Secondary | ICD-10-CM | POA: Insufficient documentation

## 2015-06-06 LAB — URINE MICROSCOPIC-ADD ON

## 2015-06-06 LAB — PREGNANCY, URINE: PREG TEST UR: NEGATIVE

## 2015-06-06 LAB — URINALYSIS, ROUTINE W REFLEX MICROSCOPIC
Bilirubin Urine: NEGATIVE
GLUCOSE, UA: NEGATIVE mg/dL
HGB URINE DIPSTICK: NEGATIVE
Ketones, ur: NEGATIVE mg/dL
NITRITE: NEGATIVE
Protein, ur: NEGATIVE mg/dL
SPECIFIC GRAVITY, URINE: 1.023 (ref 1.005–1.030)
UROBILINOGEN UA: 0.2 mg/dL (ref 0.0–1.0)
pH: 5.5 (ref 5.0–8.0)

## 2015-06-06 MED ORDER — ONDANSETRON 4 MG PO TBDP: 4.0000 mg | ORAL_TABLET | Freq: Four times a day (QID) | ORAL | Status: DC | PRN

## 2015-06-06 MED ORDER — ONDANSETRON 4 MG PO TBDP
ORAL_TABLET | ORAL | Status: AC
  Filled 2015-06-06: qty 1

## 2015-06-06 MED ORDER — ONDANSETRON 4 MG PO TBDP
4.0000 mg | ORAL_TABLET | Freq: Once | ORAL | Status: AC
  Administered 2015-06-06: 4 mg via ORAL

## 2015-06-06 NOTE — ED Notes (Signed)
Vomiting x 2 days °

## 2015-06-06 NOTE — Discharge Instructions (Signed)

## 2015-06-06 NOTE — ED Provider Notes (Signed)
CSN: [id number]   Arrival date & time 06/06/15 1556  History  This chart was scribed for  Christina Mocha, MD by Bethel Born, ED Scribe. This patient was seen in room MH10/MH10 and the patient's care was started at 4:39 PM.  Chief Complaint  Patient presents with  . Emesis    HPI Patient is a 29 y.o. female presenting with vomiting. The history is provided by the patient. No language interpreter was used.  Emesis Severity:  Moderate Duration:  2 days Timing:  Constant Number of daily episodes:  2-3 Quality:  Stomach contents Progression:  Worsening Chronicity:  New Recent urination:  Normal Relieved by:  Nothing Worsened by:  Nothing tried Ineffective treatments:  None tried Associated symptoms: diarrhea   Associated symptoms: no abdominal pain, no chills, no fever and no headaches   Risk factors: no travel to endemic areas    Christina Gamble is a 29 y.o. female who presents to the Emergency Department complaining of N/V/D with onset 2 days ago. She has had 2 episodes of emesis today and 3 episodes last night. Pt notes multiple episodes of loose stool. She also has multiple pruritic insect bites to the chest, abdomen, and feet. Pt denies fever, chills, and abdominal pain. Her daughter has hand, foot, and, mouth disease but is not vomiting. No recent camping, travel, or suspicious water intake.   No past medical history on file.  Past Surgical History  Procedure Laterality Date  . Fracture surgery      No family history on file.  History  Substance Use Topics  . Smoking status: Never Smoker   . Smokeless tobacco: Not on file  . Alcohol Use: Yes     Review of Systems  Constitutional: Negative for fever and chills.  Gastrointestinal: Positive for nausea, vomiting and diarrhea. Negative for abdominal pain.  Skin: Positive for itching.  Neurological: Negative for headaches.  All other systems reviewed and are negative.   Home Medications   Prior to Admission  medications   Medication Sig Start Date End Date Taking? Authorizing Provider  naproxen (NAPROSYN) 500 MG tablet Take 1 tablet (500 mg total) by mouth 2 (two) times daily. 10/30/14   Kathrynn Speed, PA-C    Allergies  Review of patient's allergies indicates no known allergies.  Triage Vitals: BP 94/57 mmHg  Pulse 82  Temp(Src) 98.4 F (36.9 C) (Oral)  Resp 18  Ht  (1.499 m)  Wt 118 lb (53.524 kg)  BMI 23.82 kg/m2  SpO2 100%  LMP 04/27/2015  Physical Exam  Constitutional: She is oriented to person, place, and time. She appears well-developed and well-nourished. No distress.  HENT:  Head: Normocephalic and atraumatic.  Mouth/Throat: Oropharynx is clear and moist.  Eyes: EOM are normal. Pupils are equal, round, and reactive to light.  Neck: Normal range of motion. Neck supple.  Cardiovascular: Normal rate and regular rhythm.  Exam reveals no friction rub.   No murmur heard. Pulmonary/Chest: Effort normal and breath sounds normal. No respiratory distress. She has no wheezes. She has no rales.  Abdominal: Soft. She exhibits no distension. There is no tenderness. There is no rebound.  Musculoskeletal: Normal range of motion. She exhibits no edema.  Neurological: She is alert and oriented to person, place, and time.  Skin: She is not diaphoretic.  Nursing note and vitals reviewed.   ED Course  Procedures   DIAGNOSTIC STUDIES: Oxygen Saturation is 100% on RA, normal by my interpretation.    COORDINATION OF  CARE: 4:41 PM Discussed treatment plan which includes lab work and Zofran with pt at bedside and pt agreed to plan. Pt is not able to stay for IVF because she has to pick up her daughter.   Labs Review-  Labs Reviewed  URINALYSIS, ROUTINE W REFLEX MICROSCOPIC (NOT AT [facility]) - Abnormal; Notable for the following:    APPearance CLOUDY (*)    Leukocytes, UA SMALL (*)    All other components within normal limits  URINE MICROSCOPIC-ADD ON - Abnormal; Notable for the  following:    Squamous Epithelial / LPF MANY (*)    Bacteria, UA MANY (*)    All other components within normal limits  PREGNANCY, URINE    Imaging Review No results found.  EKG Interpretation None      MDM   Final diagnoses:  Nausea vomiting and diarrhea     18F here with vomiting for 2 days. No fevers. No abdominal pain. Mild associated diarrhea. No sick contacts. No camping or recent antibiotic use. States she feels dehydrated. AFVSS here. No abdominal pain. Offered IVF and to check labs. She refused. She's not pregnant. Given ODT zofran here. Denies any urinary symptoms. UA with small leukocytes, many squamous cells, likely contaminated. Stable for discharge.  I personally performed the services described in this documentation, which was scribed in my presence. The recorded information has been reviewed and is accurate.     Christina MochaBlair Prescilla Monger, MD 06/06/15 [phone]

## 2015-08-01 ENCOUNTER — Encounter (HOSPITAL_BASED_OUTPATIENT_CLINIC_OR_DEPARTMENT_OTHER): Payer: Self-pay | Admitting: *Deleted

## 2015-08-01 ENCOUNTER — Emergency Department (HOSPITAL_BASED_OUTPATIENT_CLINIC_OR_DEPARTMENT_OTHER)
Admission: EM | Admit: 2015-08-01 | Discharge: 2015-08-01 | Disposition: A | Discharge status: Home / Self Care | Payer: Medicaid Other | Attending: Emergency Medicine | Admitting: Emergency Medicine

## 2015-08-01 DIAGNOSIS — J029 Acute pharyngitis, unspecified: Secondary | ICD-10-CM | POA: Insufficient documentation

## 2015-08-01 LAB — RAPID STREP SCREEN (MED CTR MEBANE ONLY): Streptococcus, Group A Screen (Direct): NEGATIVE

## 2015-08-01 NOTE — ED Notes (Signed)
C/o fever, chills, eye pain, sorethroat. No cough or n/v/d. Onset Saturday.

## 2015-08-01 NOTE — ED Provider Notes (Signed)
CSN: [id number]     Arrival date & time 08/01/15  0740 History   First MD Initiated Contact with Patient 08/01/15 0747     No chief complaint on file.     HPI  Patient presents for evaluation of a sore throat and fever shakes and chills. Symptoms started on Saturday, 2 days ago. She states she felt worse yesterday. Painful to swallow. States that she "thought I saw pus pocket" in looking at her throat.  No cough. No shortness of breath. No neck pain. No abdominal pain nausea or vomiting. No joint pain. No skin rash.   History reviewed. No pertinent past medical history. Past Surgical History  Procedure Laterality Date  . Fracture surgery     History reviewed. No pertinent family history. Social History  Substance Use Topics  . Smoking status: Never Smoker   . Smokeless tobacco: None  . Alcohol Use: Yes   OB History    No data available     Review of Systems  Constitutional: Positive for chills. Negative for fever, diaphoresis, appetite change and fatigue.  HENT: Positive for sore throat and trouble swallowing. Negative for mouth sores.   Eyes: Negative for visual disturbance.  Respiratory: Negative for cough, chest tightness, shortness of breath and wheezing.   Cardiovascular: Negative for chest pain.  Gastrointestinal: Negative for nausea, vomiting, abdominal pain, diarrhea and abdominal distention.  Endocrine: Negative for polydipsia, polyphagia and polyuria.  Genitourinary: Negative for dysuria, frequency and hematuria.  Musculoskeletal: Negative for gait problem.  Skin: Negative for color change, pallor and rash.  Neurological: Negative for dizziness, syncope, light-headedness and headaches.  Hematological: Does not bruise/bleed easily.  Psychiatric/Behavioral: Negative for behavioral problems and confusion.      Allergies  Review of patient's allergies indicates no known allergies.  Home Medications   Prior to Admission medications   Not on File   BP  102/63 mmHg  Pulse 72  Temp(Src) 99.4 F (37.4 C) (Oral)  Resp 16  Ht  (1.499 m)  Wt 119 lb (53.978 kg)  BMI 24.02 kg/m2  SpO2 100%  LMP 07/17/2015 Physical Exam  Constitutional: She is oriented to person, place, and time. She appears well-developed and well-nourished. No distress.  HENT:  Head: Normocephalic.  Mouth/Throat:    Eyes: Conjunctivae are normal. Pupils are equal, round, and reactive to light. No scleral icterus.  Neck: Normal range of motion. Neck supple. No thyromegaly present.    Cardiovascular: Normal rate and regular rhythm.  Exam reveals no gallop and no friction rub.   No murmur heard. Pulmonary/Chest: Effort normal and breath sounds normal. No respiratory distress. She has no wheezes. She has no rales.  Abdominal: Soft. Bowel sounds are normal. She exhibits no distension. There is no tenderness. There is no rebound.  Musculoskeletal: Normal range of motion.  Neurological: She is alert and oriented to person, place, and time.  Skin: Skin is warm and dry. No rash noted.  Psychiatric: She has a normal mood and affect. Her behavior is normal.    ED Course  Procedures (including critical care time) Labs Review Labs Reviewed  RAPID STREP SCREEN (NOT AT [facility])  CULTURE, GROUP A STREP    Imaging Review No results found. I have personally reviewed and evaluated these images and lab results as part of my medical decision-making.   EKG Interpretation None      MDM   Final diagnoses:  Pharyngitis  Viral pharyngitis    Low strep score. Negative initial strep swab. Plan  is symptomatic treatment for presumed viral pharyngitis.    Rolland Porter, MD 08/01/15 [phone]

## 2015-08-01 NOTE — Discharge Instructions (Signed)
Viral Infections  A virus is a type of germ. Viruses can cause:   Minor sore throats.   Aches and pains.   Headaches.   Runny nose.   Rashes.   Watery eyes.   Tiredness.   Coughs.   Loss of appetite.   Feeling sick to your stomach (nausea).   Throwing up (vomiting).   Watery poop (diarrhea).  HOME CARE    Only take medicines as told by your doctor.   Drink enough water and fluids to keep your pee (urine) clear or pale yellow. Sports drinks are a good choice.   Get plenty of rest and eat healthy. Soups and broths with crackers or rice are fine.  GET HELP RIGHT AWAY IF:    You have a very bad headache.   You have shortness of breath.   You have chest pain or neck pain.   You have an unusual rash.   You cannot stop throwing up.   You have watery poop that does not stop.   You cannot keep fluids down.   You or your child has a temperature by mouth above 102 F (38.9 C), not controlled by medicine.   Your baby is older than 3 months with a rectal temperature of 102 F (38.9 C) or higher.   Your baby is 3 months old or younger with a rectal temperature of 100.4 F (38 C) or higher.  MAKE SURE YOU:    Understand these instructions.   Will watch this condition.   Will get help right away if you are not doing well or get worse.  Document Released: 10/18/2008 Document Revised: 01/28/2012 Document Reviewed: 03/13/2011  Exit[employer] Patient Information 2015 [employer]. This information is not intended to replace advice given to you by your health care provider. Make sure you discuss any questions you have with your health care provider.      Pharyngitis  Pharyngitis is redness, pain, and swelling (inflammation) of your pharynx.   CAUSES   Pharyngitis is usually caused by infection. Most of the time, these infections are from viruses (viral) and are part of a cold. However, sometimes pharyngitis is caused by bacteria (bacterial). Pharyngitis can also be caused by allergies. Viral pharyngitis may  be spread from person to person by coughing, sneezing, and personal items or utensils (cups, forks, spoons, toothbrushes). Bacterial pharyngitis may be spread from person to person by more intimate contact, such as kissing.   SIGNS AND SYMPTOMS   Symptoms of pharyngitis include:    Sore throat.    Tiredness (fatigue).    Low-grade fever.    Headache.   Joint pain and muscle aches.   Skin rashes.   Swollen lymph nodes.   Plaque-like film on throat or tonsils (often seen with bacterial pharyngitis).  DIAGNOSIS   Your health care provider will ask you questions about your illness and your symptoms. Your medical history, along with a physical exam, is often all that is needed to diagnose pharyngitis. Sometimes, a rapid strep test is done. Other lab tests may also be done, depending on the suspected cause.   TREATMENT   Viral pharyngitis will usually get better in 3-4 days without the use of medicine. Bacterial pharyngitis is treated with medicines that kill germs (antibiotics).   HOME CARE INSTRUCTIONS    Drink enough water and fluids to keep your urine clear or pale yellow.    Only take over-the-counter or prescription medicines as directed by your health care provider:      If you are prescribed antibiotics, make sure you finish them even if you start to feel better.    Do not take aspirin.    Get lots of rest.    Gargle with 8 oz of salt water ( tsp of salt per 1 qt of water) as often as every 1-2 hours to soothe your throat.    Throat lozenges (if you are not at risk for choking) or sprays may be used to soothe your throat.  SEEK MEDICAL CARE IF:    You have large, tender lumps in your neck.   You have a rash.   You cough up green, yellow-brown, or bloody spit.  SEEK IMMEDIATE MEDICAL CARE IF:    Your neck becomes stiff.   You drool or are unable to swallow liquids.   You vomit or are unable to keep medicines or liquids down.   You have severe pain that does not go away with the use of  recommended medicines.   You have trouble breathing (not caused by a stuffy nose).  MAKE SURE YOU:    Understand these instructions.   Will watch your condition.   Will get help right away if you are not doing well or get worse.  Document Released: 11/05/2005 Document Revised: 08/26/2013 Document Reviewed: 07/13/2013  Exit[employer] Patient Information 2015 [employer]. This information is not intended to replace advice given to you by your health care provider. Make sure you discuss any questions you have with your health care provider.

## 2015-08-03 LAB — CULTURE, GROUP A STREP: STREP A CULTURE: NEGATIVE

## 2015-10-18 ENCOUNTER — Emergency Department (HOSPITAL_BASED_OUTPATIENT_CLINIC_OR_DEPARTMENT_OTHER)
Admission: EM | Admit: 2015-10-18 | Discharge: 2015-10-18 | Disposition: A | Discharge status: Home / Self Care | Payer: Medicaid Other | Attending: Emergency Medicine | Admitting: Emergency Medicine

## 2015-10-18 ENCOUNTER — Encounter (HOSPITAL_BASED_OUTPATIENT_CLINIC_OR_DEPARTMENT_OTHER): Payer: Self-pay

## 2015-10-18 DIAGNOSIS — J209 Acute bronchitis, unspecified: Secondary | ICD-10-CM

## 2015-10-18 MED ORDER — AZITHROMYCIN 250 MG PO TABS
ORAL_TABLET | ORAL | Status: DC
Start: 2015-10-18 — End: 2016-03-05

## 2015-10-18 NOTE — Discharge Instructions (Signed)
Zithromax as prescribed.  Continue over-the-counter medications as needed for cough or congestion.  Return to the emergency department if you develop severe chest pain, difficulty breathing, or other new and concerning symptoms.   Acute Bronchitis Bronchitis is inflammation of the airways that extend from the windpipe into the lungs (bronchi). The inflammation often causes mucus to develop. This leads to a cough, which is the most common symptom of bronchitis.  In acute bronchitis, the condition usually develops suddenly and goes away over time, usually in a couple weeks. Smoking, allergies, and asthma can make bronchitis worse. Repeated episodes of bronchitis may cause further lung problems.  CAUSES Acute bronchitis is most often caused by the same virus that causes a cold. The virus can spread from person to person (contagious) through coughing, sneezing, and touching contaminated objects. SIGNS AND SYMPTOMS   Cough.   Fever.   Coughing up mucus.   Body aches.   Chest congestion.   Chills.   Shortness of breath.   Sore throat.  DIAGNOSIS  Acute bronchitis is usually diagnosed through a physical exam. Your health care provider will also ask you questions about your medical history. Tests, such as chest X-rays, are sometimes done to rule out other conditions.  TREATMENT  Acute bronchitis usually goes away in a couple weeks. Oftentimes, no medical treatment is necessary. Medicines are sometimes given for relief of fever or cough. Antibiotic medicines are usually not needed but may be prescribed in certain situations. In some cases, an inhaler may be recommended to help reduce shortness of breath and control the cough. A cool mist vaporizer may also be used to help thin bronchial secretions and make it easier to clear the chest.  HOME CARE INSTRUCTIONS  Get plenty of rest.   Drink enough fluids to keep your urine clear or pale yellow (unless you have a medical condition  that requires fluid restriction). Increasing fluids may help thin your respiratory secretions (sputum) and reduce chest congestion, and it will prevent dehydration.   Take medicines only as directed by your health care provider.  If you were prescribed an antibiotic medicine, finish it all even if you start to feel better.  Avoid smoking and secondhand smoke. Exposure to cigarette smoke or irritating chemicals will make bronchitis worse. If you are a smoker, consider using nicotine gum or skin patches to help control withdrawal symptoms. Quitting smoking will help your lungs heal faster.   Reduce the chances of another bout of acute bronchitis by washing your hands frequently, avoiding people with cold symptoms, and trying not to touch your hands to your mouth, nose, or eyes.   Keep all follow-up visits as directed by your health care provider.  SEEK MEDICAL CARE IF: Your symptoms do not improve after 1 week of treatment.  SEEK IMMEDIATE MEDICAL CARE IF:  You develop an increased fever or chills.   You have chest pain.   You have severe shortness of breath.  You have bloody sputum.   You develop dehydration.  You faint or repeatedly feel like you are going to pass out.  You develop repeated vomiting.  You develop a severe headache. MAKE SURE YOU:   Understand these instructions.  Will watch your condition.  Will get help right away if you are not doing well or get worse.   This information is not intended to replace advice given to you by your health care provider. Make sure you discuss any questions you have with your health care provider.  Document Released: 12/13/2004 Document Revised: 11/26/2014 Document Reviewed: 04/28/2013 Elsevier Interactive Patient Education [employer].

## 2015-10-18 NOTE — ED Notes (Signed)
C/o prod cough x 2 weeks 

## 2015-10-18 NOTE — ED Provider Notes (Signed)
CSN: [id number]     Arrival date & time 10/18/15  1434 History   First MD Initiated Contact with Patient 10/18/15 1503     Chief Complaint  Patient presents with  . Cough     (Consider location/radiation/quality/duration/timing/severity/associated sxs/prior Treatment) Patient is a 29 y.o. female presenting with cough. The history is provided by the patient.  Cough Cough characteristics:  Productive Sputum characteristics:  Yellow Severity:  Moderate Onset quality:  Gradual Duration:  2 weeks Timing:  Constant Progression:  Worsening Chronicity:  New Smoker: no   Relieved by:  Nothing Worsened by:  Nothing tried Ineffective treatments:  Cough suppressants and decongestant   History reviewed. No pertinent past medical history. Past Surgical History  Procedure Laterality Date  . Fracture surgery     No family history on file. Social History  Substance Use Topics  . Smoking status: Never Smoker   . Smokeless tobacco: None  . Alcohol Use: No   OB History    No data available     Review of Systems  Respiratory: Positive for cough.   All other systems reviewed and are negative.     Allergies  Review of patient's allergies indicates no known allergies.  Home Medications   Prior to Admission medications   Not on File   BP 108/55 mmHg  Pulse 85  Temp(Src) 98.6 F (37 C) (Oral)  Resp 16  Ht 4\' 11"  (1.499 m)  Wt 120 lb (54.432 kg)  BMI 24.22 kg/m2  SpO2 100%  LMP 09/27/2015 Physical Exam  Constitutional: She is oriented to person, place, and time. She appears well-developed and well-nourished. No distress.  HENT:  Head: Normocephalic and atraumatic.  Mouth/Throat: Oropharynx is clear and moist.  TMs are clear bilaterally.  Neck: Normal range of motion. Neck supple.  Cardiovascular: Normal rate and regular rhythm.  Exam reveals no gallop and no friction rub.   No murmur heard. Pulmonary/Chest: Effort normal and breath sounds normal. No respiratory  distress. She has no wheezes. She has no rales.  Abdominal: Soft. Bowel sounds are normal. She exhibits no distension. There is no tenderness.  Musculoskeletal: Normal range of motion.  Lymphadenopathy:    She has no cervical adenopathy.  Neurological: She is alert and oriented to person, place, and time.  Skin: Skin is warm and dry. She is not diaphoretic.  Nursing note and vitals reviewed.   ED Course  Procedures (including critical care time) Labs Review Labs Reviewed - No data to display  Imaging Review No results found. I have personally reviewed and evaluated these images and lab results as part of my medical decision-making.   EKG Interpretation None      MDM   Final diagnoses:  None    Patient presents with a 2+ week history of persistent productive cough unrelieved with over-the-counter medications. Her lungs are clear and oxygen saturations are 100%. As she is not improving despite conservative measures, I will prescribe Zithromax for presumed bronchitis. She is to return as needed for any problems.    Geoffery Lyonsouglas Phuc Kluttz, MD 10/18/15 [id number]

## 2016-03-05 ENCOUNTER — Emergency Department (HOSPITAL_BASED_OUTPATIENT_CLINIC_OR_DEPARTMENT_OTHER)
Admission: EM | Admit: 2016-03-05 | Discharge: 2016-03-05 | Disposition: A | Discharge status: Home / Self Care | Payer: Medicaid Other | Attending: Emergency Medicine | Admitting: Emergency Medicine

## 2016-03-05 ENCOUNTER — Encounter (HOSPITAL_BASED_OUTPATIENT_CLINIC_OR_DEPARTMENT_OTHER): Payer: Self-pay

## 2016-03-05 DIAGNOSIS — R509 Fever, unspecified: Secondary | ICD-10-CM | POA: Insufficient documentation

## 2016-03-05 DIAGNOSIS — Z3202 Encounter for pregnancy test, result negative: Secondary | ICD-10-CM | POA: Insufficient documentation

## 2016-03-05 DIAGNOSIS — R110 Nausea: Secondary | ICD-10-CM

## 2016-03-05 DIAGNOSIS — R197 Diarrhea, unspecified: Secondary | ICD-10-CM | POA: Insufficient documentation

## 2016-03-05 DIAGNOSIS — R112 Nausea with vomiting, unspecified: Secondary | ICD-10-CM | POA: Insufficient documentation

## 2016-03-05 LAB — URINALYSIS, ROUTINE W REFLEX MICROSCOPIC
Bilirubin Urine: NEGATIVE
Glucose, UA: NEGATIVE mg/dL
Hgb urine dipstick: NEGATIVE
Ketones, ur: NEGATIVE mg/dL
LEUKOCYTES UA: NEGATIVE
Nitrite: NEGATIVE
PROTEIN: 30 mg/dL — AB
Specific Gravity, Urine: 1.026 (ref 1.005–1.030)
pH: 8.5 — ABNORMAL HIGH (ref 5.0–8.0)

## 2016-03-05 LAB — PREGNANCY, URINE: Preg Test, Ur: NEGATIVE

## 2016-03-05 LAB — URINE MICROSCOPIC-ADD ON: RBC / HPF: NONE SEEN RBC/hpf (ref 0–5)

## 2016-03-05 MED ORDER — PROMETHAZINE HCL 25 MG PO TABS: 25.0000 mg | ORAL_TABLET | Freq: Four times a day (QID) | ORAL | Status: DC | PRN

## 2016-03-05 NOTE — ED Notes (Signed)
C/o n/d,fever x 3 days-NAD-steady gait

## 2016-03-05 NOTE — Discharge Instructions (Signed)
Return to the ED with any concerns including vomiting and not able to keep down liquids, abdominal pain, fainting, decreased level of alertness/lethargy, or any other alarming symptoms °

## 2016-03-05 NOTE — ED Provider Notes (Signed)
CSN: [id number]     Arrival date & time 03/05/16  1754 History  By signing my name below, I, Linus GalasMaharshi Patel, attest that this documentation has been prepared under the direction and in the presence of No att. providers found. Electronically Signed: Linus GalasMaharshi Patel, ED Scribe. 03/06/2016. 8:13 PM.   Chief Complaint  Patient presents with  . Diarrhea   The history is provided by the patient. No language interpreter was used.   HPI Comments: Christina Gamble is a 30 y.o. female who presents to the Emergency Department complaining of diarrhea that began 3 days ago. Pt also reports vomiting and fever, Tmax 100 F. She states her diarrhea is green in color. Pt states she had 3 episode of diarrhea today. Pt denies any abdominal pain, CP, SOB, cough, or any other symptoms at this time. Pt denies any sick contacts. She has been drinking liquids well today.  No dysuria, urinary urgeny or frequency.  No recent travel.  No blood or mucous in stools.  There are no other associated systemic symptoms, there are no other alleviating or modifying factors.   History reviewed. No pertinent past medical history. Past Surgical History  Procedure Laterality Date  . Fracture surgery     No family history on file. Social History  Substance Use Topics  . Smoking status: Never Smoker   . Smokeless tobacco: None  . Alcohol Use: No   OB History    No data available     Review of Systems  Constitutional: Positive for fever and chills.  Respiratory: Negative for cough and shortness of breath.   Cardiovascular: Negative for chest pain.  Gastrointestinal: Positive for vomiting and diarrhea. Negative for abdominal pain.  All other systems reviewed and are negative.  Allergies  Review of patient's allergies indicates no known allergies.  Home Medications   Prior to Admission medications   Medication Sig Start Date End Date Taking? Authorizing Provider  promethazine (PHENERGAN) 25 MG tablet Take 1 tablet (25 mg  total) by mouth every 6 (six) hours as needed for nausea or vomiting. 03/05/16   Jerelyn ScottMartha Linker, MD   BP 90/61 mmHg  Pulse 65  Temp(Src) 98.3 F (36.8 C) (Oral)  Resp 18  Ht 4\' 11"  (1.499 m)  Wt 115 lb (52.164 kg)  BMI 23.21 kg/m2  SpO2 100%  LMP 03/02/2016  Vitals reviewed Physical Exam  Physical Examination: General appearance - alert, well appearing, and in no distress Mental status - alert, oriented to person, place, and time Eyes - no conjunctival injection, no scleral icterus Mouth - mucous membranes moist, pharynx normal without lesions Chest - clear to auscultation, no wheezes, rales or rhonchi, symmetric air entry Heart - normal rate, regular rhythm, normal S1, S2, no murmurs, rubs, clicks or gallops Abdomen - soft, nontender, nondistended, no masses or organomegaly Neurological - alert, oriented, normal speech, no focal findings or movement disorder noted Extremities - peripheral pulses normal, no pedal edema, no clubbing or cyanosis Skin - normal coloration and turgor, no rashes  ED Course  Procedures   DIAGNOSTIC STUDIES: Oxygen Saturation is 100% on room air, normal by my interpretation.    COORDINATION OF CARE: 8:09 PM Will order urinalysis, pregnancy screen, and urine culture. Discussed treatment plan including rx for antiemetic with pt at bedside and pt agreed to plan.  Labs Review Labs Reviewed  URINALYSIS, ROUTINE W REFLEX MICROSCOPIC (NOT AT [facility]) - Abnormal; Notable for the following:    pH 8.5 (*)    Protein, ur  30 (*)    All other components within normal limits  URINE MICROSCOPIC-ADD ON - Abnormal; Notable for the following:    Squamous Epithelial / LPF 0-5 (*)    Bacteria, UA MANY (*)    All other components within normal limits  URINE CULTURE  PREGNANCY, URINE    MDM   Final diagnoses:  Nausea  Diarrhea, unspecified type    Pt presenting with c/o nausea and diarrhea with low grade fever.  No abdominal pain or tenderness on exam.   Patient  is overall nontoxic and well hydrated in appearance.  Pt given rx for nausea meds and encouraged to continue po fluids by mouth.  Discharged with strict return precautions.  Pt agreeable with plan.  I personally performed the services described in this documentation, which was scribed in my presence. The recorded information has been reviewed and is accurate.     Jerelyn Scott, MD 03/06/16 [phone]

## 2016-03-05 NOTE — ED Notes (Signed)
MD at bedside. 

## 2016-03-07 LAB — URINE CULTURE

## 2016-03-22 ENCOUNTER — Emergency Department (HOSPITAL_BASED_OUTPATIENT_CLINIC_OR_DEPARTMENT_OTHER)
Admission: EM | Admit: 2016-03-22 | Discharge: 2016-03-22 | Disposition: A | Discharge status: Home / Self Care | Payer: Medicaid Other | Attending: Emergency Medicine | Admitting: Emergency Medicine

## 2016-03-22 ENCOUNTER — Encounter (HOSPITAL_BASED_OUTPATIENT_CLINIC_OR_DEPARTMENT_OTHER): Payer: Self-pay | Admitting: *Deleted

## 2016-03-22 ENCOUNTER — Emergency Department (HOSPITAL_BASED_OUTPATIENT_CLINIC_OR_DEPARTMENT_OTHER): Payer: Medicaid Other

## 2016-03-22 DIAGNOSIS — S8000XA Contusion of unspecified knee, initial encounter: Secondary | ICD-10-CM | POA: Insufficient documentation

## 2016-03-22 DIAGNOSIS — Y9389 Activity, other specified: Secondary | ICD-10-CM | POA: Insufficient documentation

## 2016-03-22 DIAGNOSIS — T07XXXA Unspecified multiple injuries, initial encounter: Secondary | ICD-10-CM

## 2016-03-22 DIAGNOSIS — Y999 Unspecified external cause status: Secondary | ICD-10-CM | POA: Insufficient documentation

## 2016-03-22 DIAGNOSIS — S1093XA Contusion of unspecified part of neck, initial encounter: Secondary | ICD-10-CM | POA: Insufficient documentation

## 2016-03-22 DIAGNOSIS — Y929 Unspecified place or not applicable: Secondary | ICD-10-CM | POA: Insufficient documentation

## 2016-03-22 LAB — PREGNANCY, URINE: Preg Test, Ur: NEGATIVE

## 2016-03-22 MED ORDER — IBUPROFEN 800 MG PO TABS
800.0000 mg | ORAL_TABLET | Freq: Once | ORAL | Status: AC
  Administered 2016-03-22: 800 mg via ORAL
  Filled 2016-03-22: qty 1

## 2016-03-22 MED ORDER — IBUPROFEN 400 MG PO TABS: 400.0000 mg | ORAL_TABLET | Freq: Three times a day (TID) | ORAL | Status: DC

## 2016-03-22 NOTE — Discharge Instructions (Signed)
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
## 2016-03-22 NOTE — ED Notes (Signed)
Pt able to ambulate without assistance but states continued pain in her knees.  Pt drinking water at bedside now, conversing on phone with her mother.  Pt in NAD, appears relaxed sitting in chair at bedside.
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
## 2016-06-26 ENCOUNTER — Emergency Department (HOSPITAL_BASED_OUTPATIENT_CLINIC_OR_DEPARTMENT_OTHER)
Admission: EM | Admit: 2016-06-26 | Discharge: 2016-06-26 | Disposition: A | Discharge status: Home / Self Care | Payer: No Typology Code available for payment source | Attending: Emergency Medicine | Admitting: Emergency Medicine

## 2016-06-26 ENCOUNTER — Encounter (HOSPITAL_BASED_OUTPATIENT_CLINIC_OR_DEPARTMENT_OTHER): Payer: Self-pay

## 2016-06-26 DIAGNOSIS — M545 Low back pain, unspecified: Secondary | ICD-10-CM

## 2016-06-26 DIAGNOSIS — N390 Urinary tract infection, site not specified: Secondary | ICD-10-CM | POA: Insufficient documentation

## 2016-06-26 LAB — URINALYSIS, ROUTINE W REFLEX MICROSCOPIC
BILIRUBIN URINE: NEGATIVE
Glucose, UA: NEGATIVE mg/dL
Hgb urine dipstick: NEGATIVE
KETONES UR: NEGATIVE mg/dL
NITRITE: NEGATIVE
PH: 6.5 (ref 5.0–8.0)
PROTEIN: NEGATIVE mg/dL
Specific Gravity, Urine: 1.026 (ref 1.005–1.030)

## 2016-06-26 LAB — URINE MICROSCOPIC-ADD ON: RBC / HPF: NONE SEEN RBC/hpf (ref 0–5)

## 2016-06-26 LAB — PREGNANCY, URINE: Preg Test, Ur: NEGATIVE

## 2016-06-26 MED ORDER — LIDOCAINE 5 % EX PTCH: 1.0000 | MEDICATED_PATCH | CUTANEOUS | 0 refills | Status: DC

## 2016-06-26 MED ORDER — METHOCARBAMOL 500 MG PO TABS: 500.0000 mg | ORAL_TABLET | Freq: Two times a day (BID) | ORAL | 0 refills | Status: DC

## 2016-06-26 MED ORDER — NAPROXEN 500 MG PO TABS: 500.0000 mg | ORAL_TABLET | Freq: Two times a day (BID) | ORAL | 0 refills | Status: DC

## 2016-06-26 MED ORDER — SULFAMETHOXAZOLE-TRIMETHOPRIM 800-160 MG PO TABS: 1.0000 | ORAL_TABLET | Freq: Two times a day (BID) | ORAL | 0 refills | Status: AC

## 2016-06-26 NOTE — ED Notes (Signed)
Pt reports mid back pain, worsening over the past few days. Reports having spasms while working. Pt with "soreness" upon palpation.

## 2016-06-26 NOTE — Discharge Instructions (Signed)
You have been seen today for lower back pain. There is evidence of an UTI on the urinalysis. This may be the cause of your discomfort. This may also be an incidental finding and the pain could be muscular.  Take it easy, but do not lay around too much as this may make the stiffness worse. Take 500 mg of naproxen every 12 hours or 800 mg of ibuprofen every 8 hours for the next 3 days. Take these medications with food to avoid upset stomach. Robaxin is a muscle relaxer and may help loosen stiff muscles. Do not take the Robaxin while driving or performing other dangerous activities.  Follow up with PCP should symptoms continue. Return to ED should symptoms worsen.

## 2016-06-26 NOTE — ED Triage Notes (Addendum)
C/o upper/mid back pain x 2 days-no known injury-does heavy lifting at job-NAD-steady gait-texting in triage

## 2016-06-26 NOTE — ED Notes (Signed)
Pt attempting to obtain urine specimen at this time.

## 2016-06-26 NOTE — ED Provider Notes (Signed)
[facility]-EMERGENCY DEPT Provider Note   CSN: [id number] Arrival date & time: 06/26/16  4098  First Provider Contact:  First MD Initiated Contact with Patient 06/26/16 2103     By signing my name below, I, Aggie Moats, attest that this documentation has been prepared under the direction and in the presence of Harolyn Rutherford, PA-C Electronically signed by: Aggie Moats, ED Scribe. 06/26/16. 9:11 PM.    History   Chief Complaint Chief Complaint  Patient presents with  . Back Pain      The history is provided by the patient. No language interpreter was used.   HPI Comments:  Christina Gamble is a 30 y.o. female who presents to the Emergency Department complaining of bilateral back pain, which started 2-3 days ago. Pain is worse on the left and characterized as a soreness. 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 Associated symptoms include intermittent back spasms. She has tried soaking in epsom salts and taking Tylenol, with no relief. Denies fever/chills, neuro deficits, bowel or bladder abnormalities, or any other complaints. Pt has hx of UTI, but reports that it has been a "long time" since last infection.     Past Surgical History:  Procedure Laterality Date  . FRACTURE SURGERY      OB History    No data available       Home Medications    Prior to Admission medications   Medication Sig Start Date End Date Taking? Authorizing Provider  lidocaine (LIDODERM) 5 % Place 1 patch onto the skin daily. Remove & Discard patch within 12 hours or as directed by MD 06/26/16   Anselm Pancoast, PA-C  methocarbamol (ROBAXIN) 500 MG tablet Take 1 tablet (500 mg total) by mouth 2 (two) times daily. 06/26/16   Shawn C Joy, PA-C  naproxen (NAPROSYN) 500 MG tablet Take 1 tablet (500 mg total) by mouth 2 (two) times daily. 06/26/16   Shawn C Joy, PA-C  sulfamethoxazole-trimethoprim (BACTRIM DS,SEPTRA DS) 800-160 MG tablet Take 1 tablet by mouth 2 (two) times daily. 06/26/16 07/03/16  Anselm Pancoast, PA-C    Family History No family history on file.  Social History Social History  Substance Use Topics  . Smoking status: Never Smoker  . Smokeless tobacco: Never Used  . Alcohol use No     Allergies   Review of patient's allergies indicates no known allergies.   Review of Systems Review of Systems  Genitourinary: Negative for dysuria.  Musculoskeletal: Positive for back pain and myalgias.  Neurological: Negative for weakness.     Physical Exam Updated Vital Signs BP 93/59 (BP Location: Left Arm)   Pulse 69   Temp 98.2 F (36.8 C) (Oral)   Resp 18   Ht 4\' 11"  (1.499 m)   Wt 119 lb (54 kg)   LMP 05/25/2016   SpO2 100%   BMI 24.04 kg/m   Physical Exam  Constitutional: She is oriented to person, place, and time. She appears well-developed and well-nourished. No distress.  HENT:  Head: Normocephalic and atraumatic.  Eyes: Conjunctivae are normal.  Neck: Neck supple.  Cardiovascular: Normal rate and regular rhythm.   Pulmonary/Chest: Effort normal.  Musculoskeletal: She exhibits tenderness.  Some tenderness to left lumbar musculature. Full ROM in all extremities and spine. No paraspinal tenderness.    Neurological: She is alert and oriented to person, place, and time.  No sensory deficits. Strength 5/5 in all extremities. No gait disturbance. Coordination intact.  Skin: Skin is warm and dry. She is not diaphoretic.  Psychiatric: She has a normal mood and affect. Her behavior is normal.  Nursing note and vitals reviewed.    ED Treatments / Results  DIAGNOSTIC STUDIES:  Oxygen Saturation is 100% on room air, normal by my interpretation.    COORDINATION OF CARE:  9:05 PM Discussed treatment plan with pt at bedside, which includes a short course of Bactrim for possible UTI and treatment for muscular pain, and pt agreed to plan. Advised to follow up with PCP or ED if sxs worsen.  Labs (all labs ordered are listed, but only abnormal results are  displayed) Labs Reviewed  URINALYSIS, ROUTINE W REFLEX MICROSCOPIC (NOT AT [facility]) - Abnormal; Notable for the following:       Result Value   APPearance CLOUDY (*)    Leukocytes, UA MODERATE (*)    All other components within normal limits  URINE MICROSCOPIC-ADD ON - Abnormal; Notable for the following:    Squamous Epithelial / LPF 6-30 (*)    Bacteria, UA MANY (*)    All other components within normal limits  URINE CULTURE  PREGNANCY, URINE    EKG  EKG Interpretation None       Radiology No results found.  Procedures Procedures (including critical care time)  Medications Ordered in ED Medications - No data to display   Initial Impression / Assessment and Plan / ED Course  I have reviewed the triage vital signs and the nursing notes.  Pertinent labs & imaging results that were available during my care of the patient were reviewed by me and considered in my medical decision making (see chart for details).  Clinical Course    Christina Gamble presents with lower back pain that began a few days ago.  Differential for this patient's pain is muscular strain versus UTI. UA supports UTI, however, muscular strain is still an additional possibility. Both issues were treated. The patient was given instructions for home care as well as return precautions. Patient voices understanding of these instructions, accepts the plan, and is comfortable with discharge.   Vitals:   06/26/16 1945 06/26/16 2123  BP: 93/59 99/66  Pulse: 69 74  Resp: 18   Temp: 98.2 F (36.8 C)   TempSrc: Oral   SpO2: 100% 100%  Weight: 54 kg   Height:  (1.499 m)      Final Clinical Impressions(s) / ED Diagnoses   Final diagnoses:  UTI (lower urinary tract infection)  Bilateral low back pain without sciatica    New Prescriptions Discharge Medication List as of 06/26/2016  9:12 PM    START taking these medications   Details  lidocaine (LIDODERM) 5 % Place 1 patch onto the skin daily.  Remove & Discard patch within 12 hours or as directed by MD, Starting Tue 06/26/2016, Print    methocarbamol (ROBAXIN) 500 MG tablet Take 1 tablet (500 mg total) by mouth 2 (two) times daily., Starting Tue 06/26/2016, Print    naproxen (NAPROSYN) 500 MG tablet Take 1 tablet (500 mg total) by mouth 2 (two) times daily., Starting Tue 06/26/2016, Print    sulfamethoxazole-trimethoprim (BACTRIM DS,SEPTRA DS) 800-160 MG tablet Take 1 tablet by mouth 2 (two) times daily., Starting Tue 06/26/2016, Until Tue 07/03/2016, Print      I personally performed the services described in this documentation, which was scribed in my presence. The recorded information has been reviewed and is accurate.     Anselm Pancoast, PA-C  06/28/16 0037    Doug SouSam Jacubowitz, MD 06/28/16 [phone]

## 2016-06-28 LAB — URINE CULTURE

## 2016-09-23 ENCOUNTER — Emergency Department (HOSPITAL_BASED_OUTPATIENT_CLINIC_OR_DEPARTMENT_OTHER)
Admission: EM | Admit: 2016-09-23 | Discharge: 2016-09-23 | Disposition: A | Discharge status: Home / Self Care | Payer: Medicaid Other | Attending: Emergency Medicine | Admitting: Emergency Medicine

## 2016-09-23 ENCOUNTER — Encounter (HOSPITAL_BASED_OUTPATIENT_CLINIC_OR_DEPARTMENT_OTHER): Payer: Self-pay | Admitting: Emergency Medicine

## 2016-09-23 DIAGNOSIS — R059 Cough, unspecified: Secondary | ICD-10-CM

## 2016-09-23 DIAGNOSIS — R05 Cough: Secondary | ICD-10-CM | POA: Diagnosis present

## 2016-09-23 DIAGNOSIS — J069 Acute upper respiratory infection, unspecified: Secondary | ICD-10-CM | POA: Insufficient documentation

## 2016-09-23 MED ORDER — BENZONATATE 100 MG PO CAPS: 100.0000 mg | ORAL_CAPSULE | Freq: Three times a day (TID) | ORAL | 0 refills | Status: AC | PRN

## 2016-09-23 NOTE — ED Notes (Signed)
Pt given d/c instructions as per chart. Rx x 1. Verbalizes understanding. No questions. 

## 2016-09-23 NOTE — ED Provider Notes (Signed)
[facility] Provider [facility]   CSN: [id number] Arrival date & time: 09/23/16  1716  By signing my name below, I, Christina Gamble, attest that this documentation has been prepared under the direction and in the presence of Nira ConnPedro Eduardo Anakaren Campion, MD.  Electronically Signed: Octavia HeirArianna Gamble, ED Scribe. 09/23/16. 7:06 PM.    History   Chief Complaint Chief Complaint  Patient presents with  . Cough   The history is provided by the patient. No language interpreter was used.   HPI Comments: Christina Gamble is a 30 y.o. female who presents to the Emergency Department complaining of constant, gradual worsening, productive cough x 1 week. Pt has been coughing up green sputum. Associated post-nasal drip and chest tenderness secondary to cough. She says her cough is worse at night when lying down. Pt has been around her coworker who had walking pneumonia. She has tried theraflu, vitamin C, and hot tea to alleviate her symptoms without relief. Pt has a hx of bronchitis ~ this one time one year ago. Denies rhinorrhea, congestion, fever, chills, nausea, vomiting, shortness of breath, or leg swelling.  History reviewed. No pertinent past medical history.  There are no active problems to display for this patient.   Past Surgical History:  Procedure Laterality Date  . FRACTURE SURGERY      OB History    No data available       Home Medications    Prior to Admission medications   Medication Sig Start Date End Date Taking? Authorizing Provider  benzonatate (TESSALON PERLES) 100 MG capsule Take 1 capsule (100 mg total) by mouth 3 (three) times daily as needed for cough. 09/23/16 09/28/16  Nira ConnPedro Eduardo Meda Dudzinski, MD  lidocaine (LIDODERM) 5 % Place 1 patch onto the skin daily. Remove & Discard patch within 12 hours or as directed by MD 06/26/16   Anselm PancoastShawn C Joy, PA-C  methocarbamol (ROBAXIN) 500 MG tablet Take 1 tablet (500 mg total) by mouth 2 (two) times daily. 06/26/16   Shawn C Joy, PA-C    naproxen (NAPROSYN) 500 MG tablet Take 1 tablet (500 mg total) by mouth 2 (two) times daily. 06/26/16   Anselm PancoastShawn C Joy, PA-C    Family History History reviewed. No pertinent family history.  Social History Social History  Substance Use Topics  . Smoking status: Never Smoker  . Smokeless tobacco: Never Used  . Alcohol use No     Allergies   Patient has no known allergies.   Review of Systems Review of Systems  All other systems reviewed and are negative.   A complete 10 system review of systems was obtained and all systems are negative except as noted in the HPI and PMH.    Physical Exam Updated Vital Signs BP 102/59   Pulse 71   Temp 98.1 F (36.7 C)   Resp 18   Wt 126 lb (57.2 kg)   LMP 09/17/2016   SpO2 99%   BMI 25.45 kg/m   Physical Exam  Constitutional: She is oriented to person, place, and time. She appears well-developed and well-nourished. No distress.  HENT:  Head: Normocephalic and atraumatic.  Nose: Mucosal edema present.  Swollen nasal mucosa mostly on left and post nasal drip, mild posterior oropharynx irritation  Eyes: Conjunctivae and EOM are normal. Pupils are equal, round, and reactive to light. Right eye exhibits no discharge. Left eye exhibits no discharge. No scleral icterus.  Neck: Normal range of motion. Neck supple.  Cardiovascular: Normal rate and regular rhythm.  Exam reveals no gallop and no friction rub.   No murmur heard. Pulmonary/Chest: Effort normal and breath sounds normal. No stridor. No respiratory distress. She has no rales.  Abdominal: Soft. She exhibits no distension. There is no tenderness.  Musculoskeletal: She exhibits no edema or tenderness.  Neurological: She is alert and oriented to person, place, and time.  Skin: Skin is warm and dry. No rash noted. She is not diaphoretic. No erythema.  Psychiatric: She has a normal mood and affect.  Nursing note and vitals reviewed.    ED Treatments / Results  DIAGNOSTIC  STUDIES: Oxygen Saturation is 99% on RA, normal by my interpretation.  COORDINATION OF CARE:  6:59 PM Discussed treatment plan with pt at bedside and pt agreed to plan.  Labs (all labs ordered are listed, but only abnormal results are displayed) Labs Reviewed - No data to display  EKG  EKG Interpretation None       Radiology No results found.  Procedures Procedures (including critical care time)  Medications Ordered in ED Medications - No data to display   Initial Impression / Assessment and Plan / ED Course  I have reviewed the triage vital signs and the nursing notes.  Pertinent labs & imaging results that were available during my care of the patient were reviewed by me and considered in my medical decision making (see chart for details).  Clinical Course     30 y.o. female presents with cough, rhinorrhea for 7 days. adequate oral hydration. Rest of history as above.  Patient appears well. No signs of toxicity. No hypoxia, tachypnea or other signs of respiratory distress. No sign of clinical dehydration. Lung exam clear. Rest of exam as above.  Most consistent with viral upper respiratory infection.   No evidence suggestive of pharyngitis, AOM, PNA, or meningitis.    Chest x-ray not indicated at this time.  Discussed symptomatic treatment with the parents and they will follow closely with their PCP.      Final Clinical Impressions(s) / ED Diagnoses   Final diagnoses:  Upper respiratory tract infection, unspecified type  Cough   I personally performed the services described in this documentation, which was scribed in my presence. The recorded information has been reviewed and is accurate.   Disposition: Discharge  Condition: Good  I have discussed the results, Dx and Tx plan with the patient who expressed understanding and agree(s) with the plan. Discharge instructions discussed at great length. The patient was given strict return precautions who  verbalized understanding of the instructions. No further questions at time of discharge.    New Prescriptions   BENZONATATE (TESSALON PERLES) 100 MG CAPSULE    Take 1 capsule (100 mg total) by mouth 3 (three) times daily as needed for cough.    Follow Up: [facility] [address] [place] [id number] [phone] Call  For help establishing care with a care provider      Nira ConnPedro Eduardo Amanee Iacovelli, MD 09/23/16 1925

## 2016-09-23 NOTE — ED Triage Notes (Signed)
Pt in c/o productive cough x 1 week. Pt alert, interactive, ambulatory in NAD.

## 2016-09-23 NOTE — ED Notes (Signed)
Patient denies pain and is resting comfortably.  

## 2017-03-22 ENCOUNTER — Emergency Department (HOSPITAL_BASED_OUTPATIENT_CLINIC_OR_DEPARTMENT_OTHER)
Admission: EM | Admit: 2017-03-22 | Discharge: 2017-03-22 | Disposition: A | Discharge status: Home / Self Care | Payer: Medicaid Other | Attending: Emergency Medicine | Admitting: Emergency Medicine

## 2017-03-22 ENCOUNTER — Encounter (HOSPITAL_BASED_OUTPATIENT_CLINIC_OR_DEPARTMENT_OTHER): Payer: Self-pay | Admitting: Emergency Medicine

## 2017-03-22 DIAGNOSIS — R8271 Bacteriuria: Secondary | ICD-10-CM | POA: Diagnosis not present

## 2017-03-22 DIAGNOSIS — M545 Low back pain, unspecified: Secondary | ICD-10-CM

## 2017-03-22 DIAGNOSIS — Z79899 Other long term (current) drug therapy: Secondary | ICD-10-CM | POA: Diagnosis not present

## 2017-03-22 LAB — URINALYSIS, ROUTINE W REFLEX MICROSCOPIC
Bilirubin Urine: NEGATIVE
GLUCOSE, UA: NEGATIVE mg/dL
HGB URINE DIPSTICK: NEGATIVE
KETONES UR: NEGATIVE mg/dL
Nitrite: NEGATIVE
PROTEIN: NEGATIVE mg/dL
Specific Gravity, Urine: 1.028 (ref 1.005–1.030)
pH: 6.5 (ref 5.0–8.0)

## 2017-03-22 LAB — URINALYSIS, MICROSCOPIC (REFLEX): RBC / HPF: NONE SEEN RBC/hpf (ref 0–5)

## 2017-03-22 LAB — PREGNANCY, URINE: PREG TEST UR: NEGATIVE

## 2017-03-22 MED ORDER — CEPHALEXIN 500 MG PO CAPS: 500.0000 mg | ORAL_CAPSULE | Freq: Three times a day (TID) | ORAL | 0 refills | Status: DC

## 2017-03-22 MED ORDER — METHOCARBAMOL 500 MG PO TABS: 1000.0000 mg | ORAL_TABLET | Freq: Three times a day (TID) | ORAL | 0 refills | Status: DC | PRN

## 2017-03-22 MED ORDER — CEPHALEXIN 250 MG PO CAPS
500.0000 mg | ORAL_CAPSULE | Freq: Once | ORAL | Status: AC
  Administered 2017-03-22: 500 mg via ORAL
  Filled 2017-03-22: qty 2

## 2017-03-22 MED ORDER — KETOROLAC TROMETHAMINE 60 MG/2ML IM SOLN
60.0000 mg | Freq: Once | INTRAMUSCULAR | Status: AC
  Administered 2017-03-22: 60 mg via INTRAMUSCULAR
  Filled 2017-03-22: qty 2

## 2017-03-22 MED ORDER — IBUPROFEN 600 MG PO TABS: 600.0000 mg | ORAL_TABLET | Freq: Three times a day (TID) | ORAL | 0 refills | Status: DC | PRN

## 2017-03-22 MED FILL — CEPHALEXIN 500 MG CAPSULE: 500 | 7 days supply | Qty: 21 | Fill #0

## 2017-03-22 NOTE — ED Provider Notes (Signed)
[facility]   CSN: [id number] Arrival date & time: 03/22/17  [id number]     History   Chief Complaint Chief Complaint  Patient presents with  . Back Pain    HPI Christina Gamble is a 31 y.o. female.  HPI Patient states she woke up this morning with left-sided low back pain. Pain is worse with movement. Does not radiate down her leg. No known injuries. No focal weakness or numbness. No incontinence. Denies fever or chills. Denies urinary frequency or urgency or dysuria. States she slept on the other side of the bed and thinks this may have contributed to her pain. History reviewed. No pertinent past medical history.  There are no active problems to display for this patient.   Past Surgical History:  Procedure Laterality Date  . FRACTURE SURGERY      OB History    No data available       Home Medications    Prior to Admission medications   Medication Sig Start Date End Date Taking? Authorizing Provider  cephALEXin (KEFLEX) 500 MG capsule Take 1 capsule (500 mg total) by mouth 3 (three) times daily. 03/22/17   Loren Raceravid Mackinze Criado, MD  ibuprofen (ADVIL,MOTRIN) 600 MG tablet Take 1 tablet (600 mg total) by mouth 3 (three) times daily with meals as needed for moderate pain. 03/22/17   Loren Raceravid Aaren Krog, MD  lidocaine (LIDODERM) 5 % Place 1 patch onto the skin daily. Remove & Discard patch within 12 hours or as directed by MD 06/26/16   Anselm PancoastShawn C Joy, PA-C  methocarbamol (ROBAXIN) 500 MG tablet Take 2 tablets (1,000 mg total) by mouth every 8 (eight) hours as needed for muscle spasms. 03/22/17   Loren Raceravid Kaleia Longhi, MD    Family History No family history on file.  Social History Social History  Substance Use Topics  . Smoking status: Never Smoker  . Smokeless tobacco: Never Used  . Alcohol use No     Allergies   Patient has no known allergies.   Review of Systems Review of Systems  Constitutional: Negative for chills and fever.  Gastrointestinal: Negative for  nausea.  Genitourinary: Positive for flank pain. Negative for difficulty urinating, dysuria, frequency and hematuria.  Musculoskeletal: Positive for back pain and myalgias.  Skin: Negative for rash and wound.  Neurological: Negative for weakness and numbness.  All other systems reviewed and are negative.    Physical Exam Updated Vital Signs BP 113/63 (BP Location: Right Arm)   Pulse (!) 51   Temp 98.2 F (36.8 C) (Oral)   Resp 16   Ht 4\' 11"  (1.499 m)   Wt 122 lb 6 oz (55.5 kg)   LMP 03/14/2017 (Exact Date)   SpO2 100%   BMI 24.72 kg/m   Physical Exam  Constitutional: She is oriented to person, place, and time. She appears well-developed and well-nourished.  HENT:  Head: Normocephalic and atraumatic.  Eyes: EOM are normal. Pupils are equal, round, and reactive to light.  Neck: Normal range of motion. Neck supple.  Cardiovascular: Normal rate and regular rhythm.   Pulmonary/Chest: Effort normal and breath sounds normal.  Abdominal: Soft. Bowel sounds are normal. There is no tenderness. There is no rebound and no guarding.  Musculoskeletal: Normal range of motion. She exhibits tenderness. She exhibits no edema.  No midline thoracic or lumbar tenderness. Patient does have left-sided lumbar paraspinal tenderness to palpation. Questionable left CVA tenderness. Negative straight leg raise. Distal pulses are 2+. No lower extremity swelling, asymmetry or  tenderness.  Neurological: She is alert and oriented to person, place, and time.  5/5 motor in all extremities. Sensation fully intact. No saddle seizure. Ambulating without difficulty.  Skin: Skin is warm and dry. No rash noted. No erythema.  Psychiatric: She has a normal mood and affect. Her behavior is normal.  Nursing note and vitals reviewed.    ED Treatments / Results  Labs (all labs ordered are listed, but only abnormal results are displayed) Labs Reviewed  URINALYSIS, ROUTINE W REFLEX MICROSCOPIC - Abnormal; Notable for  the following:       Result Value   APPearance CLOUDY (*)    Leukocytes, UA TRACE (*)    All other components within normal limits  URINALYSIS, MICROSCOPIC (REFLEX) - Abnormal; Notable for the following:    Bacteria, UA MANY (*)    Squamous Epithelial / LPF 6-30 (*)    All other components within normal limits  URINE CULTURE  PREGNANCY, URINE    EKG  EKG Interpretation None       Radiology No results found.  Procedures Procedures (including critical care time)  Medications Ordered in ED Medications  ketorolac (TORADOL) injection 60 mg (60 mg Intramuscular Given 03/22/17 0928)  cephALEXin (KEFLEX) capsule 500 mg (500 mg Oral Given 03/22/17 1610)     Initial Impression / Assessment and Plan / ED Course  I have reviewed the triage vital signs and the nursing notes.  Pertinent labs & imaging results that were available during my care of the patient were reviewed by me and considered in my medical decision making (see chart for details).     No red flag signs or symptoms. Patient does have bacteriuria and trace leukocytes. Sample is contaminated. Given concern for possible early ascending urinary tract infection will start on antibiotics. Urine culture sent. Patient is instructed to return immediately for any worsening of her symptoms, fever or for any concerns.  Final Clinical Impressions(s) / ED Diagnoses   Final diagnoses:  Bacteriuria  Acute left-sided low back pain without sciatica    New Prescriptions New Prescriptions   CEPHALEXIN (KEFLEX) 500 MG CAPSULE    Take 1 capsule (500 mg total) by mouth 3 (three) times daily.   IBUPROFEN (ADVIL,MOTRIN) 600 MG TABLET    Take 1 tablet (600 mg total) by mouth 3 (three) times daily with meals as needed for moderate pain.     Loren Racer, MD 03/22/17 [phone]

## 2017-03-22 NOTE — ED Triage Notes (Signed)
Woke up with lower back pain. Denies urinary s/s, states slept of different side of the bed last night.

## 2017-03-23 LAB — URINE CULTURE

## 2017-04-04 ENCOUNTER — Encounter (HOSPITAL_BASED_OUTPATIENT_CLINIC_OR_DEPARTMENT_OTHER): Payer: Self-pay | Admitting: Emergency Medicine

## 2017-04-04 ENCOUNTER — Emergency Department (HOSPITAL_BASED_OUTPATIENT_CLINIC_OR_DEPARTMENT_OTHER)
Admission: EM | Admit: 2017-04-04 | Discharge: 2017-04-04 | Disposition: A | Discharge status: Home / Self Care | Payer: Medicaid Other | Attending: Emergency Medicine | Admitting: Emergency Medicine

## 2017-04-04 DIAGNOSIS — Z79899 Other long term (current) drug therapy: Secondary | ICD-10-CM | POA: Insufficient documentation

## 2017-04-04 DIAGNOSIS — Z791 Long term (current) use of non-steroidal anti-inflammatories (NSAID): Secondary | ICD-10-CM | POA: Diagnosis not present

## 2017-04-04 DIAGNOSIS — N760 Acute vaginitis: Secondary | ICD-10-CM | POA: Insufficient documentation

## 2017-04-04 DIAGNOSIS — N898 Other specified noninflammatory disorders of vagina: Secondary | ICD-10-CM | POA: Diagnosis present

## 2017-04-04 HISTORY — DX: Anxiety disorder, unspecified: F41.9

## 2017-04-04 LAB — URINALYSIS, ROUTINE W REFLEX MICROSCOPIC
Bilirubin Urine: NEGATIVE
GLUCOSE, UA: NEGATIVE mg/dL
KETONES UR: NEGATIVE mg/dL
Nitrite: NEGATIVE
PH: 6 (ref 5.0–8.0)
Protein, ur: NEGATIVE mg/dL
Specific Gravity, Urine: 1.019 (ref 1.005–1.030)

## 2017-04-04 LAB — WET PREP, GENITAL
SPERM: NONE SEEN
Trich, Wet Prep: NONE SEEN
Yeast Wet Prep HPF POC: NONE SEEN

## 2017-04-04 LAB — URINALYSIS, MICROSCOPIC (REFLEX)

## 2017-04-04 LAB — PREGNANCY, URINE: Preg Test, Ur: NEGATIVE

## 2017-04-04 MED ORDER — FLUCONAZOLE 150 MG PO TABS: 150.0000 mg | ORAL_TABLET | Freq: Once | ORAL | 0 refills | Status: AC

## 2017-04-04 MED ORDER — METRONIDAZOLE 500 MG PO TABS: 500.0000 mg | ORAL_TABLET | Freq: Two times a day (BID) | ORAL | 0 refills | Status: DC

## 2017-04-04 MED FILL — FLUCONAZOLE 150 MG TABLET: 150 | 1 days supply | Qty: 1 | Fill #0

## 2017-04-04 MED FILL — metroNIDAZOLE 500 MG TABS: 500 | 7 days supply | Qty: 14 | Fill #0

## 2017-04-04 NOTE — ED Triage Notes (Signed)
Pt states she was prescribed keflex for a UTI last week and now has vaginal itching x 2-3 days. Pt states it feels like a yeast infection. Pt also states she would like her test results from last visit because she has a different phone number that was not on file.

## 2017-04-04 NOTE — ED Notes (Signed)
ED Provider at bedside. 

## 2017-04-04 NOTE — Discharge Instructions (Signed)
Discontinue Keflex (Cephalexin). Take the medications prescribed. If you're not better in a week, see your gynecologist in the office. Return if concern for any reason. You been tested for sexually transmitted diseases. We will call you if those tests are abnormal

## 2017-04-04 NOTE — ED Provider Notes (Signed)
[facility]   CSN: [id number] Arrival date & time: 04/04/17  0759     History   Chief Complaint Chief Complaint  Patient presents with  . Vaginal Itching    HPI Christina Gamble is a 31 y.o. female.  HPI complains of vaginal itching for 2 days.. No other associated symptoms no fever no nausea or vomiting no urinary symptoms. Patient seen here 03/22/2017 for back pain treated with Keflex for possible urinary tract infection. She states the back pain has resolved and she has no urinary symptoms. She continues to take Keflex. Urine culture sent from 03/22/2017 was indeterminate. No other associated symptoms. Last normal menstrual period 03/15/2007  Past Medical History:  Diagnosis Date  . Anxiety     There are no active problems to display for this patient.   Past Surgical History:  Procedure Laterality Date  . FRACTURE SURGERY      OB History    No data available       Home Medications    Prior to Admission medications   Medication Sig Start Date End Date Taking? Authorizing Provider  ALPRAZolam Prudy Feeler) 0.5 MG tablet Take 0.5 mg by mouth at bedtime as needed for anxiety.   Yes [provider]  cephALEXin (KEFLEX) 500 MG capsule Take 1 capsule (500 mg total) by mouth 3 (three) times daily. 03/22/17   Loren Racer, MD  ibuprofen (ADVIL,MOTRIN) 600 MG tablet Take 1 tablet (600 mg total) by mouth 3 (three) times daily with meals as needed for moderate pain. 03/22/17   Loren Racer, MD  lidocaine (LIDODERM) 5 % Place 1 patch onto the skin daily. Remove & Discard patch within 12 hours or as directed by MD 06/26/16   Joy, Shawn C, PA-C  methocarbamol (ROBAXIN) 500 MG tablet Take 2 tablets (1,000 mg total) by mouth every 8 (eight) hours as needed for muscle spasms. 03/22/17   Loren Racer, MD    Family History No family history on file.  Social History Social History  Substance Use Topics  . Smoking status: Never Smoker  .  Smokeless tobacco: Never Used  . Alcohol use No     Allergies   Patient has no known allergies.   Review of Systems Review of Systems  Constitutional: Negative.   HENT: Negative.   Respiratory: Negative.   Cardiovascular: Negative.   Gastrointestinal: Negative.   Genitourinary:       Vaginal itching  Musculoskeletal: Negative.   Skin: Negative.   Neurological: Negative.   Psychiatric/Behavioral: Negative.   All other systems reviewed and are negative.    Physical Exam Updated Vital Signs BP 97/72 (BP Location: Right Arm)   Pulse 71   Temp 97.4 F (36.3 C) (Oral)   Resp 18   Ht 4\' 11"  (1.499 m)   Wt 122 lb (55.3 kg)   LMP 03/14/2017 (Exact Date)   SpO2 100%   BMI 24.64 kg/m   Physical Exam  Constitutional: She appears well-developed and well-nourished. No distress.  HENT:  Head: Normocephalic and atraumatic.  Eyes: Conjunctivae are normal. Pupils are equal, round, and reactive to light.  Neck: Neck supple. No tracheal deviation present. No thyromegaly present.  Cardiovascular: Normal rate and regular rhythm.   No murmur heard. Pulmonary/Chest: Effort normal and breath sounds normal.  Abdominal: Soft. Bowel sounds are normal. She exhibits no distension. There is no tenderness.  Genitourinary:  Genitourinary Comments: No external lesion. Copious yellowish cottage cheeselike vaginal discharge. Cervical os closed. No cervical motion  tenderness no adnexal masses or tenderness  Musculoskeletal: Normal range of motion. She exhibits no edema or tenderness.  Neurological: She is alert. Coordination normal.  Skin: Skin is warm and dry. No rash noted.  Psychiatric: She has a normal mood and affect.  Nursing note and vitals reviewed.    ED Treatments / Results  Labs (all labs ordered are listed, but only abnormal results are displayed) Labs Reviewed  WET PREP, GENITAL  URINE CULTURE  RPR  HIV ANTIBODY (ROUTINE TESTING)  PREGNANCY, URINE  GC/CHLAMYDIA PROBE AMP  ([place] NOT AT [facility]    EKG  EKG Interpretation None       Radiology No results found.  Procedures Procedures (including critical care time)  Medications Ordered in ED Medications - No data to display  Results for orders placed or performed during the hospital encounter of 04/04/17  Wet prep, genital  Result Value Ref Range   Yeast Wet Prep HPF POC NONE SEEN NONE SEEN   Trich, Wet Prep NONE SEEN NONE SEEN   Clue Cells Wet Prep HPF POC PRESENT (A) NONE SEEN   WBC, Wet Prep HPF POC MANY (A) NONE SEEN   Sperm NONE SEEN   Pregnancy, urine  Result Value Ref Range   Preg Test, Ur NEGATIVE NEGATIVE  Urinalysis, Routine w reflex microscopic  Result Value Ref Range   Color, Urine YELLOW YELLOW   APPearance CLOUDY (A) CLEAR   Specific Gravity, Urine 1.019 1.005 - 1.030   pH 6.0 5.0 - 8.0   Glucose, UA NEGATIVE NEGATIVE mg/dL   Hgb urine dipstick TRACE (A) NEGATIVE   Bilirubin Urine NEGATIVE NEGATIVE   Ketones, ur NEGATIVE NEGATIVE mg/dL   Protein, ur NEGATIVE NEGATIVE mg/dL   Nitrite NEGATIVE NEGATIVE   Leukocytes, UA LARGE (A) NEGATIVE  Urinalysis, Microscopic (reflex)  Result Value Ref Range   RBC / HPF 0-5 0 - 5 RBC/hpf   WBC, UA 6-30 0 - 5 WBC/hpf   Bacteria, UA MANY (A) NONE SEEN   Squamous Epithelial / LPF 0-5 (A) NONE SEEN   Budding Yeast PRESENT    No results found. Initial Impression / Assessment and Plan / ED Course  I have reviewed the triage vital signs and the nursing notes.  Pertinent labs & imaging results that were available during my care of the patient were reviewed by me and considered in my medical decision making (see chart for details).     9:20 AM patient resting comfortably. We'll treat empirically for yeast vaginitis as her gross exam shows cause cheeselike discharge and she's been recently on Keflex. Prescriptions Flagyl, Diflucan, discontinue Keflex. Urine sent for culture. HIV RPR GC Chlamydia results pending. Follow-up with GYN if not  better in a week  Diagnosis vaginitis  Final diagnoses:  None    New Prescriptions New Prescriptions   No medications on file     Doug SouJacubowitz, Shaquitta Burbridge, MD 04/04/17 [phone]

## 2017-04-05 LAB — URINE CULTURE: Special Requests: NORMAL

## 2017-04-05 LAB — GC/CHLAMYDIA PROBE AMP (~~LOC~~) NOT AT ARMC
CHLAMYDIA, DNA PROBE: NEGATIVE
NEISSERIA GONORRHEA: NEGATIVE

## 2017-04-05 LAB — HIV ANTIBODY (ROUTINE TESTING W REFLEX): HIV SCREEN 4TH GENERATION: NONREACTIVE

## 2017-04-05 LAB — RPR: RPR: NONREACTIVE

## 2017-12-23 ENCOUNTER — Emergency Department (HOSPITAL_BASED_OUTPATIENT_CLINIC_OR_DEPARTMENT_OTHER)
Admission: EM | Admit: 2017-12-23 | Discharge: 2017-12-23 | Disposition: A | Discharge status: Home / Self Care | Payer: Self-pay | Attending: Emergency Medicine | Admitting: Emergency Medicine

## 2017-12-23 ENCOUNTER — Other Ambulatory Visit: Payer: Self-pay

## 2017-12-23 ENCOUNTER — Encounter (HOSPITAL_BASED_OUTPATIENT_CLINIC_OR_DEPARTMENT_OTHER): Payer: Self-pay

## 2017-12-23 DIAGNOSIS — R112 Nausea with vomiting, unspecified: Secondary | ICD-10-CM | POA: Insufficient documentation

## 2017-12-23 DIAGNOSIS — B349 Viral infection, unspecified: Secondary | ICD-10-CM | POA: Insufficient documentation

## 2017-12-23 DIAGNOSIS — R197 Diarrhea, unspecified: Secondary | ICD-10-CM | POA: Insufficient documentation

## 2017-12-23 MED ORDER — ONDANSETRON 8 MG PO TBDP: ORAL_TABLET | ORAL | 0 refills | Status: DC

## 2017-12-23 MED ORDER — IBUPROFEN 400 MG PO TABS
400.0000 mg | ORAL_TABLET | Freq: Once | ORAL | Status: AC
  Administered 2017-12-23: 400 mg via ORAL
  Filled 2017-12-23: qty 1

## 2017-12-23 NOTE — Discharge Instructions (Signed)
Zofran as prescribed as needed for nausea.  Ibuprofen 600 mg rotated with Tylenol 1000 mg every 4 hours as needed for pain or fever.  Clear liquid diet for the next 12 hours, then slowly advance to normal.  Return to the emergency department if your symptoms significantly worsen or change.

## 2017-12-23 NOTE — ED Provider Notes (Signed)
[facility] EMERGENCY DEPARTMENT Provider Note   CSN: [id number] Arrival date & time: 12/23/17  1906     History   Chief Complaint Chief Complaint  Patient presents with  . Cough    HPI Christina Gamble is a 32 y.o. female.  Patient is a 32 year old female with past medical history of anxiety presenting for evaluation of URI-like symptoms for the past week.  This morning she began with nausea, vomiting, diarrhea.  She has had multiple episodes of this throughout the day.  She has felt chilled but has not taken her temperature.  All has been nonbloody and nonbilious.  She denies any urinary complaints.   The history is provided by the patient.  Cough  This is a new problem. Episode onset: URI symptoms last week, GI symptoms this morning. The problem occurs constantly. The problem has not changed since onset.The cough is non-productive. There has been no fever. Associated symptoms include chills. She has tried nothing for the symptoms.    Past Medical History:  Diagnosis Date  . Anxiety     There are no active problems to display for this patient.   Past Surgical History:  Procedure Laterality Date  . FRACTURE SURGERY      OB History    No data available       Home Medications    Prior to Admission medications   Not on File    Family History No family history on file.  Social History Social History   Tobacco Use  . Smoking status: Never Smoker  . Smokeless tobacco: Never Used  Substance Use Topics  . Alcohol use: Yes    Comment: occ  . Drug use: No     Allergies   Patient has no known allergies.   Review of Systems Review of Systems  Constitutional: Positive for chills.  Respiratory: Positive for cough.   All other systems reviewed and are negative.    Physical Exam Updated Vital Signs BP 102/71 (BP Location: Right Arm)   Pulse 74   Temp 98.7 F (37.1 C)   Resp 20   Ht 4\' 11"  (1.499 m)   Wt 57.8 kg (127 lb 6.8 oz)   LMP  12/16/2017   SpO2 99%   BMI 25.74 kg/m   Physical Exam  Constitutional: She is oriented to person, place, and time. She appears well-developed and well-nourished. No distress.  HENT:  Head: Normocephalic and atraumatic.  Mouth/Throat: Oropharynx is clear and moist.  Neck: Normal range of motion. Neck supple.  Cardiovascular: Normal rate and regular rhythm. Exam reveals no gallop and no friction rub.  No murmur heard. Pulmonary/Chest: Effort normal and breath sounds normal. No respiratory distress. She has no wheezes.  Abdominal: Soft. Bowel sounds are normal. She exhibits no distension. There is no tenderness.  Musculoskeletal: Normal range of motion.  Neurological: She is alert and oriented to person, place, and time.  Skin: Skin is warm and dry. She is not diaphoretic.  Nursing note and vitals reviewed.    ED Treatments / Results  Labs (all labs ordered are listed, but only abnormal results are displayed) Labs Reviewed - No data to display  EKG  EKG Interpretation None       Radiology No results found.  Procedures Procedures (including critical care time)  Medications Ordered in ED Medications  ibuprofen (ADVIL,MOTRIN) tablet 400 mg (400 mg Oral Given 12/23/17 2005)     Initial Impression / Assessment and Plan / ED Course  I  have reviewed the triage vital signs and the nursing notes.  Pertinent labs & imaging results that were available during my care of the patient were reviewed by me and considered in my medical decision making (see chart for details).  Patient presents with 1 week of URI-like symptoms and 1 day of GI symptoms.  I highly suspect a viral etiology.  Her physical examination is unremarkable.  She has been here now for hours and has not vomited or had any additional diarrhea during her time here.  I have advised the patient that her illness is most likely viral in nature and that antibiotics play no role in the treatment.  She is requesting flu  testing, however I have explained that this would not change the management and not indicated in this situation.  She is to continue over-the-counter medications as needed for symptom relief.  I will also prescribe Zofran which she can take as needed for her nausea.  Final Clinical Impressions(s) / ED Diagnoses   Final diagnoses:  None    ED Discharge Orders    None       Geoffery Lyonselo, Naysa Puskas, MD 12/23/17 2324

## 2017-12-23 NOTE — ED Triage Notes (Signed)
C/o flu like sx x 1 week-NAD-steady gait 

## 2018-08-24 ENCOUNTER — Encounter (HOSPITAL_BASED_OUTPATIENT_CLINIC_OR_DEPARTMENT_OTHER): Payer: Self-pay | Admitting: Emergency Medicine

## 2018-08-24 ENCOUNTER — Other Ambulatory Visit: Payer: Self-pay

## 2018-08-24 ENCOUNTER — Emergency Department (HOSPITAL_BASED_OUTPATIENT_CLINIC_OR_DEPARTMENT_OTHER)
Admission: EM | Admit: 2018-08-24 | Discharge: 2018-08-25 | Disposition: A | Discharge status: Home / Self Care | Payer: BLUE CROSS/BLUE SHIELD | Attending: Emergency Medicine | Admitting: Emergency Medicine

## 2018-08-24 DIAGNOSIS — B9689 Other specified bacterial agents as the cause of diseases classified elsewhere: Secondary | ICD-10-CM | POA: Diagnosis not present

## 2018-08-24 DIAGNOSIS — R110 Nausea: Secondary | ICD-10-CM | POA: Insufficient documentation

## 2018-08-24 DIAGNOSIS — R1013 Epigastric pain: Secondary | ICD-10-CM | POA: Insufficient documentation

## 2018-08-24 DIAGNOSIS — N760 Acute vaginitis: Secondary | ICD-10-CM | POA: Diagnosis not present

## 2018-08-24 LAB — WET PREP, GENITAL
Sperm: NONE SEEN
Trich, Wet Prep: NONE SEEN
Yeast Wet Prep HPF POC: NONE SEEN

## 2018-08-24 LAB — URINALYSIS, ROUTINE W REFLEX MICROSCOPIC
BILIRUBIN URINE: NEGATIVE
Glucose, UA: NEGATIVE mg/dL
HGB URINE DIPSTICK: NEGATIVE
KETONES UR: NEGATIVE mg/dL
Leukocytes, UA: NEGATIVE
NITRITE: NEGATIVE
Protein, ur: NEGATIVE mg/dL
Specific Gravity, Urine: 1.015 (ref 1.005–1.030)
pH: 8 (ref 5.0–8.0)

## 2018-08-24 LAB — COMPREHENSIVE METABOLIC PANEL
ALBUMIN: 3.8 g/dL (ref 3.5–5.0)
ALK PHOS: 59 U/L (ref 38–126)
ALT: 19 U/L (ref 0–44)
ANION GAP: 9 (ref 5–15)
AST: 27 U/L (ref 15–41)
BILIRUBIN TOTAL: 0.5 mg/dL (ref 0.3–1.2)
BUN: 13 mg/dL (ref 6–20)
CALCIUM: 8.3 mg/dL — AB (ref 8.9–10.3)
CO2: 24 mmol/L (ref 22–32)
CREATININE: 0.56 mg/dL (ref 0.44–1.00)
Chloride: 103 mmol/L (ref 98–111)
GFR calc Af Amer: >60 mL/min (ref >60)
GFR calc non Af Amer: >60 mL/min (ref >60)
Glucose, Bld: 79 mg/dL (ref 70–99)
Potassium: 3.5 mmol/L (ref 3.5–5.1)
Sodium: 136 mmol/L (ref 135–145)
TOTAL PROTEIN: 6.9 g/dL (ref 6.5–8.1)

## 2018-08-24 LAB — CBC
HCT: 34.2 % — ABNORMAL LOW (ref 36.0–46.0)
Hemoglobin: 11.7 g/dL — ABNORMAL LOW (ref 12.0–15.0)
MCH: 24.6 pg — AB (ref 26.0–34.0)
MCHC: 34.2 g/dL (ref 30.0–36.0)
MCV: 71.8 fL — ABNORMAL LOW (ref 78.0–100.0)
Platelets: 250 10*3/uL (ref 150–400)
RBC: 4.76 MIL/uL (ref 3.87–5.11)
RDW: 16.2 % — AB (ref 11.5–15.5)
WBC: 7.5 10*3/uL (ref 4.0–10.5)

## 2018-08-24 LAB — PREGNANCY, URINE: PREG TEST UR: NEGATIVE

## 2018-08-24 LAB — LIPASE, BLOOD: Lipase: 35 U/L (ref 11–51)

## 2018-08-24 MED ORDER — HYDROCODONE-ACETAMINOPHEN 5-325 MG PO TABS: 1.0000 | ORAL_TABLET | Freq: Four times a day (QID) | ORAL | 0 refills | Status: DC | PRN

## 2018-08-24 MED ORDER — FAMOTIDINE IN NACL 20-0.9 MG/50ML-% IV SOLN
20.0000 mg | Freq: Once | INTRAVENOUS | Status: AC
  Administered 2018-08-24: 20 mg via INTRAVENOUS
  Filled 2018-08-24: qty 50

## 2018-08-24 MED ORDER — METRONIDAZOLE 500 MG PO TABS: 500.0000 mg | ORAL_TABLET | Freq: Two times a day (BID) | ORAL | 0 refills | Status: DC

## 2018-08-24 MED ORDER — KETOROLAC TROMETHAMINE 30 MG/ML IJ SOLN
30.0000 mg | Freq: Once | INTRAMUSCULAR | Status: AC
  Administered 2018-08-24: 30 mg via INTRAVENOUS
  Filled 2018-08-24: qty 1

## 2018-08-24 MED ORDER — SUCRALFATE 1 GM/10ML PO SUSP: 1.0000 g | Freq: Three times a day (TID) | ORAL | 0 refills | Status: DC

## 2018-08-24 MED ORDER — ONDANSETRON HCL 4 MG PO TABS: 4.0000 mg | ORAL_TABLET | Freq: Four times a day (QID) | ORAL | 0 refills | Status: DC

## 2018-08-24 MED ORDER — FAMOTIDINE 20 MG PO TABS: 20.0000 mg | ORAL_TABLET | Freq: Two times a day (BID) | ORAL | 0 refills | Status: AC

## 2018-08-24 MED ORDER — GI COCKTAIL ~~LOC~~
30.0000 mL | Freq: Once | ORAL | Status: AC
  Administered 2018-08-24: 30 mL via ORAL
  Filled 2018-08-24: qty 30

## 2018-08-24 MED ORDER — ONDANSETRON HCL 4 MG/2ML IJ SOLN
4.0000 mg | Freq: Once | INTRAMUSCULAR | Status: AC
  Administered 2018-08-24: 4 mg via INTRAVENOUS
  Filled 2018-08-24: qty 2

## 2018-08-24 NOTE — ED Provider Notes (Signed)
[facility] EMERGENCY DEPARTMENT Provider Note   CSN: [id number] Arrival date & time: 08/24/18  2017     History   Chief Complaint Chief Complaint  Patient presents with  . Abdominal Pain    HPI Christina Gamble is a 32 y.o. female with history of anxiety who presents with a 2-day history of epigastric pain.  She describes as a burning sensation.  It has been intermittent for the past 2 days.  She has had some associated nausea, but no vomiting.  She denies any diarrhea or bloody stools.  She denies any urinary symptoms, chest pain, shortness of breath.  Nothing makes the pain better or worse.  She took Tylenol at home without relief.  She is no history of GERD or gallbladder problems.  No fevers.  Patient also reports a few day history of vaginal discharge.  She is sexually active with one partner.  She has low suspicion for STD exposure.  She denies any foul odor, but is increased than usual.  She requests pelvic exam to check.  HPI  Past Medical History:  Diagnosis Date  . Anxiety     There are no active problems to display for this patient.   Past Surgical History:  Procedure Laterality Date  . FRACTURE SURGERY       OB History   None      Home Medications    Prior to Admission medications   Medication Sig Start Date End Date Taking? Authorizing Provider  famotidine (PEPCID) 20 MG tablet Take 1 tablet (20 mg total) by mouth 2 (two) times daily. 08/24/18   Peggye Poon, Waylan Boga, PA-C  HYDROcodone-acetaminophen (NORCO/VICODIN) 5-325 MG tablet Take 1-2 tablets by mouth every 6 (six) hours as needed for severe pain. 08/24/18   Annahi Short, Waylan Boga, PA-C  metroNIDAZOLE (FLAGYL) 500 MG tablet Take 1 tablet (500 mg total) by mouth 2 (two) times daily. 08/24/18   Evanne Matsunaga, Waylan Boga, PA-C  ondansetron (ZOFRAN ODT) 8 MG disintegrating tablet 8mg  ODT q4 hours prn nausea 12/23/17   Geoffery Lyons, MD  ondansetron (ZOFRAN) 4 MG tablet Take 1 tablet (4 mg total) by mouth every 6 (six)  hours. 08/24/18   Courtney Bellizzi, Waylan Boga, PA-C  sucralfate (CARAFATE) 1 GM/10ML suspension Take 10 mLs (1 g total) by mouth 4 (four) times daily -  with meals and at bedtime. 08/24/18   Emi Holes, PA-C    Family History Family History  Problem Relation Age of Onset  . Heart attack Other     Social History Social History   Tobacco Use  . Smoking status: Never Smoker  . Smokeless tobacco: Never Used  Substance Use Topics  . Alcohol use: Yes    Comment: occ  . Drug use: No     Allergies   Patient has no known allergies.   Review of Systems Review of Systems  Constitutional: Negative for chills and fever.  HENT: Negative for facial swelling and sore throat.   Respiratory: Negative for shortness of breath.   Cardiovascular: Negative for chest pain.  Gastrointestinal: Positive for abdominal pain and nausea. Negative for vomiting.  Genitourinary: Negative for dysuria.  Musculoskeletal: Negative for back pain.  Skin: Negative for rash and wound.  Neurological: Negative for headaches.  Psychiatric/Behavioral: The patient is not nervous/anxious.      Physical Exam Updated Vital Signs BP 95/65 (BP Location: Left Arm)   Pulse 62   Temp 99.2 F (37.3 C) (Oral)   Resp 16   LMP  08/10/2018   SpO2 100%   Physical Exam  Constitutional: She appears well-developed and well-nourished. No distress.  HENT:  Head: Normocephalic and atraumatic.  Mouth/Throat: Oropharynx is clear and moist. No oropharyngeal exudate.  Eyes: Pupils are equal, round, and reactive to light. Conjunctivae are normal. Right eye exhibits no discharge. Left eye exhibits no discharge. No scleral icterus.  Neck: Normal range of motion. Neck supple. No thyromegaly present.  Cardiovascular: Normal rate, regular rhythm, normal heart sounds and intact distal pulses. Exam reveals no gallop and no friction rub.  No murmur heard. Pulmonary/Chest: Effort normal and breath sounds normal. No stridor. No respiratory  distress. She has no wheezes. She has no rales.  Abdominal: Soft. Bowel sounds are normal. She exhibits no distension. There is tenderness in the epigastric area. There is no rebound, no guarding, no CVA tenderness and negative Murphy's sign.  Genitourinary: Cervix exhibits no motion tenderness. Right adnexum displays no tenderness. Left adnexum displays no tenderness. Vaginal discharge (white, milky) found.  Genitourinary Comments: Chaperone present, IUD strings visualized at cervical os  Musculoskeletal: She exhibits no edema.  Lymphadenopathy:    She has no cervical adenopathy.  Neurological: She is alert. Coordination normal.  Skin: Skin is warm and dry. No rash noted. She is not diaphoretic. No pallor.  Psychiatric: She has a normal mood and affect.  Nursing note and vitals reviewed.    ED Treatments / Results  Labs (all labs ordered are listed, but only abnormal results are displayed) Labs Reviewed  WET PREP, GENITAL - Abnormal; Notable for the following components:      Result Value   Clue Cells Wet Prep HPF POC PRESENT (*)    WBC, Wet Prep HPF POC MODERATE (*)    All other components within normal limits  COMPREHENSIVE METABOLIC PANEL - Abnormal; Notable for the following components:   Calcium 8.3 (*)    All other components within normal limits  CBC - Abnormal; Notable for the following components:   Hemoglobin 11.7 (*)    HCT 34.2 (*)    MCV 71.8 (*)    MCH 24.6 (*)    RDW 16.2 (*)    All other components within normal limits  LIPASE, BLOOD  URINALYSIS, ROUTINE W REFLEX MICROSCOPIC  PREGNANCY, URINE  GC/CHLAMYDIA PROBE AMP ([place]) NOT AT [facility]    EKG None  Radiology No results found.  Procedures Procedures (including critical care time)  Medications Ordered in ED Medications  famotidine (PEPCID) IVPB 20 mg premix (0 mg Intravenous Stopped 08/24/18 2303)  ondansetron (ZOFRAN) injection 4 mg (4 mg Intravenous Given 08/24/18 2204)  gi cocktail  (Maalox,Lidocaine,Donnatal) (30 mLs Oral Given 08/24/18 2214)  ketorolac (TORADOL) 30 MG/ML injection 30 mg (30 mg Intravenous Given 08/24/18 2302)     Initial Impression / Assessment and Plan / ED Course  I have reviewed the triage vital signs and the nursing notes.  Pertinent labs & imaging results that were available during my care of the patient were reviewed by me and considered in my medical decision making (see chart for details).     Patient with a 2-day history of epigastric pain.  She has epigastric tenderness only.  No right upper quadrant tenderness.  Negative Murphy sign.  Labs are unremarkable, except for stable chronic anemia, hemoglobin 11.7..  Pain unresolved with Pepcid, GI cocktail in the ED.  Considering patient wanted to drive home, Toradol was attempted, but did not improve the pain.  Will discharge home with Carafate, Pepcid, Zofran, short  course of Norco, and Flagyl for BV.  GC/chlamydia sent and pending.  I reviewed the [place] narcotic database and found no discrepancies.  We will have patient return for right upper quadrant ultrasound tomorrow, although GERD versus ulcer is more likely.  Will refer to PCP for further work-up.  Return precautions discussed.  Patient understands and agrees with plan.  Patient vitals stable throughout ED course and discharged in satisfactory condition.  Final Clinical Impressions(s) / ED Diagnoses   Final diagnoses:  Epigastric pain  BV (bacterial vaginosis)    ED Discharge Orders         Ordered    US Abdomen Limited RUQ/Gall Bladder     08/24/18 2347    sucralfate (CARAFATE) 1 GM/10ML suspension  3 times daily with meals & bedtime     08/24/18 2347    famotidine (PEPCID) 20 MG tablet  2 times daily     08/24/18 2347    ondansetron (ZOFRAN) 4 MG tablet  Every 6 hours     08/24/18 2347    HYDROcodone-acetaminophen (NORCO/VICODIN) 5-325 MG tablet  Every 6 hours PRN     08/24/18 2347    metroNIDAZOLE (FLAGYL) 500 MG tablet  2 times  daily     08/24/18 2347           Emi Holes, PA-C 08/24/18 2357    Cathren Laine, MD 08/25/18 [phone]

## 2018-08-24 NOTE — ED Triage Notes (Signed)
Pt states she is having upper abdominal pain that started on Friday  Pt has some nausea but no vomiting  Last BM was this morning  Denies any urinary sxs or vaginal discharge

## 2018-08-24 NOTE — Discharge Instructions (Signed)
Medications: Carafate, Pepcid, Zofran, Flagyl, Norco  Treatment: Take Carafate prior to eating and at bedtime.  Take Pepcid twice daily as prescribed.  Take Zofran every 6 hours as needed for nausea or vomiting.  Take Flagyl until completed.  Do not drink alcohol within 24 hours of taking this medication.  For severe pain unresolved by the above medications, take 1-2 Norco every 6 hours.  Do not drive or operate machinery while taking this medication.  Do not drink alcohol, drive, operate machinery or participate in any other potentially dangerous activities while taking opiate pain medication as it may make you sleepy. Do not take this medication with any other sedating medications, either prescription or over-the-counter. If you were prescribed Percocet or Vicodin, do not take these with acetaminophen (Tylenol) as it is already contained within these medications and overdose of Tylenol is dangerous.   This medication is an opiate (or narcotic) pain medication and can be habit forming.  Use it as little as possible to achieve adequate pain control.  Do not use or use it with extreme caution if you have a history of opiate abuse or dependence. This medication is intended for your use only - do not give any to anyone else and keep it in a secure place where nobody else, especially children, have access to it. It will also cause or worsen constipation, so you may want to consider taking an over-the-counter stool softener while you are taking this medication.  Follow-up: Please follow-up for your ultrasound tomorrow as scheduled.  Please follow-up with your doctor for recheck later this week.  They may manage your symptoms themselves or refer you to gastroenterology if your symptoms are not improving.  Please return the emergency department if you develop any new or worsening symptoms including fever over 100.4, severe, worsening pain, intractable vomiting, or any other concerning symptom.

## 2018-08-25 ENCOUNTER — Ambulatory Visit (HOSPITAL_BASED_OUTPATIENT_CLINIC_OR_DEPARTMENT_OTHER)
Admission: RE | Admit: 2018-08-25 | Discharge: 2018-08-25 | Disposition: A | Discharge status: Home / Self Care | Payer: BLUE CROSS/BLUE SHIELD | Source: Ambulatory Visit | Attending: Emergency Medicine | Admitting: Emergency Medicine

## 2018-08-25 DIAGNOSIS — R1013 Epigastric pain: Secondary | ICD-10-CM

## 2018-08-25 DIAGNOSIS — K7689 Other specified diseases of liver: Secondary | ICD-10-CM | POA: Diagnosis not present

## 2018-08-26 LAB — GC/CHLAMYDIA PROBE AMP (~~LOC~~) NOT AT ARMC
Chlamydia: NEGATIVE
Neisseria Gonorrhea: NEGATIVE

## 2018-08-26 NOTE — ED Notes (Signed)
08/26/2018, 17: 42  Pt. Called and left a message for a return call.  Called pt. Back, no answer, message left for pt. To call .

## 2018-08-27 NOTE — ED Notes (Signed)
08/26/2018,  14:07, Called pt. And reviewed STD results.  All results negative.  All questions answered.

## 2018-10-27 DIAGNOSIS — E559 Vitamin D deficiency, unspecified: Secondary | ICD-10-CM | POA: Diagnosis not present

## 2018-10-27 DIAGNOSIS — F419 Anxiety disorder, unspecified: Secondary | ICD-10-CM | POA: Diagnosis not present

## 2018-10-27 DIAGNOSIS — E663 Overweight: Secondary | ICD-10-CM | POA: Diagnosis not present

## 2019-01-30 DIAGNOSIS — E559 Vitamin D deficiency, unspecified: Secondary | ICD-10-CM | POA: Diagnosis not present

## 2019-01-30 DIAGNOSIS — Z01118 Encounter for examination of ears and hearing with other abnormal findings: Secondary | ICD-10-CM | POA: Diagnosis not present

## 2019-01-30 DIAGNOSIS — E663 Overweight: Secondary | ICD-10-CM | POA: Diagnosis not present

## 2019-01-30 DIAGNOSIS — Z0001 Encounter for general adult medical examination with abnormal findings: Secondary | ICD-10-CM | POA: Diagnosis not present

## 2019-01-30 DIAGNOSIS — Z131 Encounter for screening for diabetes mellitus: Secondary | ICD-10-CM | POA: Diagnosis not present

## 2019-01-30 DIAGNOSIS — Z5181 Encounter for therapeutic drug level monitoring: Secondary | ICD-10-CM | POA: Diagnosis not present

## 2019-01-30 DIAGNOSIS — Z136 Encounter for screening for cardiovascular disorders: Secondary | ICD-10-CM | POA: Diagnosis not present

## 2019-01-30 DIAGNOSIS — F419 Anxiety disorder, unspecified: Secondary | ICD-10-CM | POA: Diagnosis not present

## 2019-06-24 DIAGNOSIS — Z20828 Contact with and (suspected) exposure to other viral communicable diseases: Secondary | ICD-10-CM | POA: Diagnosis not present

## 2019-07-29 DIAGNOSIS — E559 Vitamin D deficiency, unspecified: Secondary | ICD-10-CM | POA: Diagnosis not present

## 2019-07-29 DIAGNOSIS — F419 Anxiety disorder, unspecified: Secondary | ICD-10-CM | POA: Diagnosis not present

## 2019-07-29 DIAGNOSIS — E663 Overweight: Secondary | ICD-10-CM | POA: Diagnosis not present

## 2019-09-17 DIAGNOSIS — F419 Anxiety disorder, unspecified: Secondary | ICD-10-CM | POA: Diagnosis not present

## 2019-09-17 DIAGNOSIS — Z2821 Immunization not carried out because of patient refusal: Secondary | ICD-10-CM | POA: Diagnosis not present

## 2019-09-17 DIAGNOSIS — E559 Vitamin D deficiency, unspecified: Secondary | ICD-10-CM | POA: Diagnosis not present

## 2019-09-17 DIAGNOSIS — E663 Overweight: Secondary | ICD-10-CM | POA: Diagnosis not present

## 2019-11-10 ENCOUNTER — Other Ambulatory Visit: Payer: BLUE CROSS/BLUE SHIELD

## 2019-11-11 ENCOUNTER — Encounter (HOSPITAL_COMMUNITY): Payer: Self-pay | Admitting: Obstetrics and Gynecology

## 2019-11-11 ENCOUNTER — Inpatient Hospital Stay (HOSPITAL_COMMUNITY)
Admission: AD | Admit: 2019-11-11 | Discharge: 2019-11-11 | Disposition: A | Discharge status: Home / Self Care | Payer: BC Managed Care – PPO | Attending: Obstetrics and Gynecology | Admitting: Obstetrics and Gynecology

## 2019-11-11 ENCOUNTER — Other Ambulatory Visit: Payer: Self-pay

## 2019-11-11 ENCOUNTER — Inpatient Hospital Stay (HOSPITAL_COMMUNITY): Payer: BC Managed Care – PPO

## 2019-11-11 DIAGNOSIS — Z792 Long term (current) use of antibiotics: Secondary | ICD-10-CM | POA: Diagnosis not present

## 2019-11-11 DIAGNOSIS — Z3490 Encounter for supervision of normal pregnancy, unspecified, unspecified trimester: Secondary | ICD-10-CM

## 2019-11-11 DIAGNOSIS — O26891 Other specified pregnancy related conditions, first trimester: Secondary | ICD-10-CM

## 2019-11-11 DIAGNOSIS — O209 Hemorrhage in early pregnancy, unspecified: Secondary | ICD-10-CM | POA: Insufficient documentation

## 2019-11-11 DIAGNOSIS — O99891 Other specified diseases and conditions complicating pregnancy: Secondary | ICD-10-CM | POA: Insufficient documentation

## 2019-11-11 DIAGNOSIS — Z79899 Other long term (current) drug therapy: Secondary | ICD-10-CM | POA: Diagnosis not present

## 2019-11-11 DIAGNOSIS — Z3A01 Less than 8 weeks gestation of pregnancy: Secondary | ICD-10-CM | POA: Diagnosis not present

## 2019-11-11 DIAGNOSIS — O26899 Other specified pregnancy related conditions, unspecified trimester: Secondary | ICD-10-CM

## 2019-11-11 DIAGNOSIS — R109 Unspecified abdominal pain: Secondary | ICD-10-CM | POA: Diagnosis not present

## 2019-11-11 DIAGNOSIS — R1030 Lower abdominal pain, unspecified: Secondary | ICD-10-CM | POA: Insufficient documentation

## 2019-11-11 LAB — CBC WITH DIFFERENTIAL/PLATELET
Abs Immature Granulocytes: 0.03 10*3/uL (ref 0.00–0.07)
Basophils Absolute: 0 10*3/uL (ref 0.0–0.1)
Basophils Relative: 1 %
Eosinophils Absolute: 0.1 10*3/uL (ref 0.0–0.5)
Eosinophils Relative: 1 %
HCT: 30.2 % — ABNORMAL LOW (ref 36.0–46.0)
Hemoglobin: 10.2 g/dL — ABNORMAL LOW (ref 12.0–15.0)
Immature Granulocytes: 1 %
Lymphocytes Relative: 43 %
Lymphs Abs: 2.6 10*3/uL (ref 0.7–4.0)
MCH: 24.5 pg — ABNORMAL LOW (ref 26.0–34.0)
MCHC: 33.8 g/dL (ref 30.0–36.0)
MCV: 72.4 fL — ABNORMAL LOW (ref 80.0–100.0)
Monocytes Absolute: 0.7 10*3/uL (ref 0.1–1.0)
Monocytes Relative: 12 %
Neutro Abs: 2.5 10*3/uL (ref 1.7–7.7)
Neutrophils Relative %: 42 %
Platelets: 257 10*3/uL (ref 150–400)
RBC: 4.17 MIL/uL (ref 3.87–5.11)
RDW: 15.6 % — ABNORMAL HIGH (ref 11.5–15.5)
WBC: 5.9 10*3/uL (ref 4.0–10.5)
nRBC: 0 % (ref 0.0–0.2)

## 2019-11-11 LAB — COMPREHENSIVE METABOLIC PANEL
ALT: 13 U/L (ref 0–44)
AST: 16 U/L (ref 15–41)
Albumin: 3.2 g/dL — ABNORMAL LOW (ref 3.5–5.0)
Alkaline Phosphatase: 48 U/L (ref 38–126)
Anion gap: 5 (ref 5–15)
BUN: 7 mg/dL (ref 6–20)
CO2: 26 mmol/L (ref 22–32)
Calcium: 8.8 mg/dL — ABNORMAL LOW (ref 8.9–10.3)
Chloride: 108 mmol/L (ref 98–111)
Creatinine, Ser: 0.77 mg/dL (ref 0.44–1.00)
GFR calc Af Amer: >60 mL/min (ref >60)
GFR calc non Af Amer: >60 mL/min (ref >60)
Glucose, Bld: 84 mg/dL (ref 70–99)
Potassium: 3.2 mmol/L — ABNORMAL LOW (ref 3.5–5.1)
Sodium: 139 mmol/L (ref 135–145)
Total Bilirubin: 0.4 mg/dL (ref 0.3–1.2)
Total Protein: 6 g/dL — ABNORMAL LOW (ref 6.5–8.1)

## 2019-11-11 LAB — URINALYSIS, ROUTINE W REFLEX MICROSCOPIC
Bilirubin Urine: NEGATIVE
Glucose, UA: NEGATIVE mg/dL
Hgb urine dipstick: NEGATIVE
Ketones, ur: NEGATIVE mg/dL
Leukocytes,Ua: NEGATIVE
Nitrite: NEGATIVE
Protein, ur: NEGATIVE mg/dL
Specific Gravity, Urine: 1.011 (ref 1.005–1.030)
pH: 6 (ref 5.0–8.0)

## 2019-11-11 LAB — POCT PREGNANCY, URINE: Preg Test, Ur: POSITIVE — AB

## 2019-11-11 LAB — HCG, QUANTITATIVE, PREGNANCY: hCG, Beta Chain, Quant, S: 63183 m[IU]/mL — ABNORMAL HIGH (ref <5)

## 2019-11-11 NOTE — Discharge Instructions (Signed)
Prenatal Care Providers           [facility]   Phone: [phone]  [facility]   Phone: [phone]  [facility]       Phone: [phone]            [facility]     Phone: [phone]          [facility]   Phone: [phone]  [facility]  Phone: [phone]  [facility] Phone: [phone]     [facility]  Phone: [phone]  [facility]  Phone: [phone]  [facility] Phone: [phone]  [facility] Phone: [phone]  [facility] Phone: [phone]  [facility] Phone: [phone]  [facility]  Phone: [phone]   

## 2019-11-11 NOTE — MAU Note (Signed)
Is pregnant, started spotting today- brownish.  Has been cramping in lower abd, felt like she was going to come on her cycle.  Feeling a little nauseated. 4 +HPT

## 2019-11-11 NOTE — MAU Provider Note (Signed)
History     CSN: [id number]  Arrival date and time: 11/11/19 1610   First Provider Initiated Contact with Patient 11/11/19 1722      Chief Complaint  Patient presents with  . Vaginal Bleeding  . Abdominal Pain  . Possible Pregnancy   HPI   Ms.Christina Gamble is a 33 y.o. female G2P1001 @ 7419w3d by LMP here in MAU reporting spotting and lower abdominal cramping. The symptoms started today. She has had 4 positive home pregnancy tests. The spotting only occurred this morning and she has not seen the spotting since. The spotting was very light. She reports period like cramping that has come and gone most of today. She has not taken anything for the discomfort.  OB History    Gravida  2   Para  1   Term  1   Preterm      AB      Living  1     SAB      TAB      Ectopic      Multiple      Live Births  1           Past Medical History:  Diagnosis Date  . Anxiety     Past Surgical History:  Procedure Laterality Date  . FRACTURE SURGERY      Family History  Problem Relation Age of Onset  . Heart attack Other   . Heart attack Maternal Grandmother     Social History   Tobacco Use  . Smoking status: Never Smoker  . Smokeless tobacco: Never Used  Substance Use Topics  . Alcohol use: Not Currently    Comment: occ  . Drug use: No    Allergies: No Known Allergies  Medications Prior to Admission  Medication Sig Dispense Refill Last Dose  . Ascorbic Acid (VITAMIN C) 1000 MG tablet Take 1,000 mg by mouth daily.   Past Week at Unknown time  . famotidine (PEPCID) 20 MG tablet Take 1 tablet (20 mg total) by mouth 2 (two) times daily. 30 tablet 0   . HYDROcodone-acetaminophen (NORCO/VICODIN) 5-325 MG tablet Take 1-2 tablets by mouth every 6 (six) hours as needed for severe pain. 10 tablet 0   . metroNIDAZOLE (FLAGYL) 500 MG tablet Take 1 tablet (500 mg total) by mouth 2 (two) times daily. 14 tablet 0   . ondansetron (ZOFRAN ODT) 8 MG disintegrating tablet  8mg  ODT q4 hours prn nausea 6 tablet 0   . ondansetron (ZOFRAN) 4 MG tablet Take 1 tablet (4 mg total) by mouth every 6 (six) hours. 12 tablet 0   . sucralfate (CARAFATE) 1 GM/10ML suspension Take 10 mLs (1 g total) by mouth 4 (four) times daily -  with meals and at bedtime. 420 mL 0    Results for orders placed or performed during the hospital encounter of 11/11/19 (from the past 48 hour(s))  Pregnancy, urine POC     Status: Abnormal   Collection Time: 11/11/19  4:33 PM  Result Value Ref Range   Preg Test, Ur POSITIVE (A) NEGATIVE    Comment:        THE SENSITIVITY OF THIS METHODOLOGY IS >24 mIU/mL   Urinalysis, Routine w reflex microscopic     Status: Abnormal   Collection Time: 11/11/19  4:36 PM  Result Value Ref Range   Color, Urine STRAW (A) YELLOW   APPearance CLEAR CLEAR   Specific Gravity, Urine 1.011 1.005 - 1.030   pH 6.0  5.0 - 8.0   Glucose, UA NEGATIVE NEGATIVE mg/dL   Hgb urine dipstick NEGATIVE NEGATIVE   Bilirubin Urine NEGATIVE NEGATIVE   Ketones, ur NEGATIVE NEGATIVE mg/dL   Protein, ur NEGATIVE NEGATIVE mg/dL   Nitrite NEGATIVE NEGATIVE   Leukocytes,Ua NEGATIVE NEGATIVE    Comment: Performed at [facility] [address] [place], [place] [address]  CBC with Differential/Platelet     Status: Abnormal   Collection Time: 11/11/19  5:38 PM  Result Value Ref Range   WBC 5.9 4.0 - 10.5 K/uL   RBC 4.17 3.87 - 5.11 MIL/uL   Hemoglobin 10.2 (L) 12.0 - 15.0 g/dL   HCT 30.2 (L) 36.0 - 46.0 %   MCV 72.4 (L) 80.0 - 100.0 fL   MCH 24.5 (L) 26.0 - 34.0 pg   MCHC 33.8 30.0 - 36.0 g/dL   RDW 15.6 (H) 11.5 - 15.5 %   Platelets 257 150 - 400 K/uL   nRBC 0.0 0.0 - 0.2 %   Neutrophils Relative % 42 %   Neutro Abs 2.5 1.7 - 7.7 K/uL   Lymphocytes Relative 43 %   Lymphs Abs 2.6 0.7 - 4.0 K/uL   Monocytes Relative 12 %   Monocytes Absolute 0.7 0.1 - 1.0 K/uL   Eosinophils Relative 1 %   Eosinophils Absolute 0.1 0.0 - 0.5 K/uL   Basophils Relative 1 %   Basophils  Absolute 0.0 0.0 - 0.1 K/uL   Immature Granulocytes 1 %   Abs Immature Granulocytes 0.03 0.00 - 0.07 K/uL    Comment: Performed at [facility], [address], [place], [place] [address]  Comprehensive metabolic panel     Status: Abnormal   Collection Time: 11/11/19  5:38 PM  Result Value Ref Range   Sodium 139 135 - 145 mmol/L   Potassium 3.2 (L) 3.5 - 5.1 mmol/L   Chloride 108 98 - 111 mmol/L   CO2 26 22 - 32 mmol/L   Glucose, Bld 84 70 - 99 mg/dL   BUN 7 6 - 20 mg/dL   Creatinine, Ser 0.77 0.44 - 1.00 mg/dL   Calcium 8.8 (L) 8.9 - 10.3 mg/dL   Total Protein 6.0 (L) 6.5 - 8.1 g/dL   Albumin 3.2 (L) 3.5 - 5.0 g/dL   AST 16 15 - 41 U/L   ALT 13 0 - 44 U/L   Alkaline Phosphatase 48 38 - 126 U/L   Total Bilirubin 0.4 0.3 - 1.2 mg/dL   GFR calc non Af Amer >60 >60 mL/min   GFR calc Af Amer >60 >60 mL/min   Anion gap 5 5 - 15    Comment: Performed at [facility], [place] [address], [place], [place] [address]  hCG, quantitative, pregnancy     Status: Abnormal   Collection Time: 11/11/19  5:38 PM  Result Value Ref Range   hCG, Beta Chain, Quant, S 63,183 (H) <5 mIU/mL    Comment:          GEST. AGE      CONC.  (mIU/mL)   <=1 WEEK        5 - 50     2 WEEKS       50 - 500     3 WEEKS       100 - 10,000     4 WEEKS     1,000 - 30,000     5 WEEKS     3,500 - 115,000   6-8  WEEKS     12,000 - 270,000    12 WEEKS     15,000 - 220,000        FEMALE AND NON-PREGNANT FEMALE:     LESS THAN 5 mIU/mL Performed at [facility], [address], [place], [place] [address]    US OB LESS THAN 14 WEEKS WITH OB TRANSVAGINAL  Result Date: 11/11/2019 CLINICAL DATA:  Abdominal pain in first trimester of pregnancy, 5 weeks 3 days EGA by LMP EXAM: OBSTETRIC <14 WK [place] AND TRANSVAGINAL OB US TECHNIQUE: Both transabdominal and transvaginal ultrasound examinations were performed for complete evaluation of the gestation as well as the maternal uterus, adnexal regions, and pelvic cul-de-sac.  Transvaginal technique was performed to assess early pregnancy. COMPARISON:  None FINDINGS: Intrauterine gestational sac: Present, single Yolk sac:  Present Embryo:  Present Cardiac Activity: Present Heart Rate: 112 bpm CRL:  4.2 mm   6 w   1 d                  [place] EDC: 07/05/2020 Subchorionic hemorrhage:  None visualized. Maternal uterus/adnexae: RIGHT ovary measures 3.2 x 1.8 x 2.4 cm and contains a small corpus luteum. LEFT ovary normal size and morphology 1.9 x 1.4 x 1.6 cm. Uterus anteverted and otherwise normal appearance. No free pelvic fluid or adnexal masses. IMPRESSION: Single live intrauterine gestation at 6 weeks 1 day EGA by crown-rump length. No acute abnormalities. Electronically Signed   By: Ulyses Southward M.D.   On: 11/11/2019 19:06   Review of Systems  Constitutional: Negative for fever.  Gastrointestinal: Positive for abdominal pain.  Genitourinary: Positive for vaginal bleeding.   Physical Exam   Blood pressure 104/63, pulse 77, temperature 98.4 F (36.9 C), temperature source Oral, resp. rate 16, height 4\' 11"  (1.499 m), weight 58.6 kg, last menstrual period 10/04/2019, SpO2 100 %.  Physical Exam  Constitutional: She is oriented to person, place, and time. She appears well-developed and well-nourished. No distress.  HENT:  Head: Normocephalic.  Eyes: Pupils are equal, round, and reactive to light.  GI: Soft. She exhibits no distension and no mass. There is no abdominal tenderness. There is no rebound and no guarding.  Genitourinary:    Genitourinary Comments: Bimanual exam: Cervix closed, posterior  Uterus non tender, enlarged  No blood noted on exam glove. Adnexa non tender, no masses bilaterally Chaperone present for exam.    Musculoskeletal:        General: Normal range of motion.  Neurological: She is alert and oriented to person, place, and time.  Skin: Skin is warm. She is not diaphoretic.  Psychiatric: Her behavior is normal.    MAU Course  Procedures   None  MDM  Wet prep & GC- patient declined  HIV, CBC, Hcg, ABO 10/06/2019 OB transvaginal  B positive blood type   Assessment and Plan   A:  1. Normal obstetric ultrasound scan, antepartum   2. Abdominal pain in pregnancy   3. [redacted] weeks gestation of pregnancy     P:  Discharge home in stable condition Strict return precautions Start prenatal care Daily prenatal vitamins.   [place], NP 11/11/2019 7:50 PM.

## 2019-11-14 ENCOUNTER — Other Ambulatory Visit: Payer: Self-pay

## 2019-11-14 ENCOUNTER — Emergency Department (HOSPITAL_BASED_OUTPATIENT_CLINIC_OR_DEPARTMENT_OTHER)
Admission: EM | Admit: 2019-11-14 | Discharge: 2019-11-14 | Disposition: A | Discharge status: Home / Self Care | Payer: BC Managed Care – PPO | Attending: Emergency Medicine | Admitting: Emergency Medicine

## 2019-11-14 ENCOUNTER — Encounter (HOSPITAL_BASED_OUTPATIENT_CLINIC_OR_DEPARTMENT_OTHER): Payer: Self-pay | Admitting: Emergency Medicine

## 2019-11-14 DIAGNOSIS — U071 COVID-19: Secondary | ICD-10-CM | POA: Insufficient documentation

## 2019-11-14 DIAGNOSIS — T7840XA Allergy, unspecified, initial encounter: Secondary | ICD-10-CM | POA: Insufficient documentation

## 2019-11-14 DIAGNOSIS — O98511 Other viral diseases complicating pregnancy, first trimester: Secondary | ICD-10-CM | POA: Diagnosis not present

## 2019-11-14 DIAGNOSIS — O99711 Diseases of the skin and subcutaneous tissue complicating pregnancy, first trimester: Secondary | ICD-10-CM | POA: Diagnosis not present

## 2019-11-14 DIAGNOSIS — Z79899 Other long term (current) drug therapy: Secondary | ICD-10-CM | POA: Diagnosis not present

## 2019-11-14 DIAGNOSIS — O99891 Other specified diseases and conditions complicating pregnancy: Secondary | ICD-10-CM | POA: Insufficient documentation

## 2019-11-14 MED ORDER — METHYLPREDNISOLONE SODIUM SUCC 125 MG IJ SOLR
80.0000 mg | Freq: Once | INTRAMUSCULAR | Status: AC
  Administered 2019-11-14: 80 mg via INTRAMUSCULAR
  Filled 2019-11-14: qty 2

## 2019-11-14 MED ORDER — PREDNISONE 10 MG PO TABS: 20.0000 mg | ORAL_TABLET | Freq: Two times a day (BID) | ORAL | 0 refills | Status: DC

## 2019-11-14 MED ORDER — DIPHENHYDRAMINE HCL 50 MG/ML IJ SOLN
50.0000 mg | Freq: Once | INTRAMUSCULAR | Status: AC
  Administered 2019-11-14: 50 mg via INTRAMUSCULAR
  Filled 2019-11-14: qty 1

## 2019-11-14 NOTE — ED Triage Notes (Signed)
Pt states around 1800 she started having itching and breaking out in hives  Pt states her eyes are swelling  States she ate some chinese food  Denies using anything different as far as soaps, detergent, or anything else  Pt states she was diagnosed with COVID yesterday

## 2019-11-14 NOTE — ED Notes (Signed)
Pt c/o itching to entire body but worse at her scalp.

## 2019-11-14 NOTE — Discharge Instructions (Signed)
Begin taking prednisone as prescribed.  Take Benadryl 25 mg every 6 hours as needed for itching.  Follow-up with primary doctor if symptoms or not improving, and return to the ER if you develop throat swelling, difficulty breathing, or other new and concerning symptoms.

## 2019-11-14 NOTE — ED Provider Notes (Signed)
[facility] EMERGENCY DEPARTMENT Provider Note   CSN: [id number] Arrival date & time: 11/14/19  2263     History Chief Complaint  Patient presents with  . Allergic Reaction    Christina Gamble is a 33 y.o. female.  Patient is a 33 year old female with history of anxiety presenting with complaints of rash and itching.  Patient diagnosed with Covid yesterday and found out 2 days ago that she was pregnant.  Patient reports eating [place] food this evening, but denies any other new contacts or exposures.  She denies any difficulty breathing or swallowing.  The history is provided by the patient.  Allergic Reaction Presenting symptoms: itching and rash   Presenting symptoms: no difficulty breathing and no difficulty swallowing   Severity:  Moderate Duration:  12 hours Prior allergic episodes:  Unable to specify Relieved by:  Nothing Worsened by:  Nothing Ineffective treatments:  None tried      Past Medical History:  Diagnosis Date  . Anxiety     There are no problems to display for this patient.   Past Surgical History:  Procedure Laterality Date  . FRACTURE SURGERY       OB History    Gravida  2   Para  1   Term  1   Preterm      AB      Living  1     SAB      TAB      Ectopic      Multiple      Live Births  1           Family History  Problem Relation Age of Onset  . Heart attack Other   . Heart attack Maternal Grandmother     Social History   Tobacco Use  . Smoking status: Never Smoker  . Smokeless tobacco: Never Used  Substance Use Topics  . Alcohol use: Not Currently    Comment: occ  . Drug use: No    Home Medications Prior to Admission medications   Medication Sig Start Date End Date Taking? Authorizing Provider  famotidine (PEPCID) 20 MG tablet Take 1 tablet (20 mg total) by mouth 2 (two) times daily. 08/24/18   Emi Holes, PA-C    Allergies    Patient has no known allergies.  Review of Systems    Review of Systems  HENT: Negative for trouble swallowing.   Skin: Positive for itching and rash.  All other systems reviewed and are negative.   Physical Exam Updated Vital Signs BP 105/70 (BP Location: Left Arm)   Pulse 76   Temp 98.9 F (37.2 C) (Oral)   Resp 20   Ht 4\' 11"  (1.499 m)   Wt 58.5 kg   LMP 10/04/2019 Comment: IUD last Spring  SpO2 100%   BMI 26.05 kg/m   Physical Exam Vitals and nursing note reviewed.  Constitutional:      General: She is not in acute distress.    Appearance: She is well-developed. She is not diaphoretic.  HENT:     Head: Normocephalic and atraumatic.  Cardiovascular:     Rate and Rhythm: Normal rate and regular rhythm.     Heart sounds: No murmur. No friction rub. No gallop.   Pulmonary:     Effort: Pulmonary effort is normal. No respiratory distress.     Breath sounds: Normal breath sounds. No wheezing.  Abdominal:     General: Bowel sounds are normal. There is no distension.  Palpations: Abdomen is soft.     Tenderness: There is no abdominal tenderness.  Musculoskeletal:        General: Normal range of motion.     Cervical back: Normal range of motion and neck supple.  Skin:    General: Skin is warm and dry.     Findings: Rash present.     Comments: There is an urticarial rash to the face, torso, and extremities.  Neurological:     Mental Status: She is alert and oriented to person, place, and time.     ED Results / Procedures / Treatments   Labs (all labs ordered are listed, but only abnormal results are displayed) Labs Reviewed - No data to display  EKG None  Radiology No results found.  Procedures Procedures (including critical care time)  Medications Ordered in ED Medications  methylPREDNISolone sodium succinate (SOLU-MEDROL) 125 mg/2 mL injection 80 mg (has no administration in time range)  diphenhydrAMINE (BENADRYL) injection 50 mg (has no administration in time range)    ED Course  I have reviewed the  triage vital signs and the nursing notes.  Pertinent labs & imaging results that were available during my care of the patient were reviewed by me and considered in my medical decision making (see chart for details).  Patient is a 33 year old female presenting with complaints of rash.  She is pregnant in the first trimester.  Patient describes itching all over and believes she may have had an allergic reaction to some [place] food she ate earlier.  Patient showing no signs of anaphylaxis.  She does have scattered urticarial lesions and pruritus.  This improved after giving Solu-Medrol and Benadryl.  Patient seems appropriate for discharge.  She will be prescribed a short course of prednisone and benadryl.  She is to return as needed for any problems.  MDM Rules/Calculators/A&P   Final Clinical Impression(s) / ED Diagnoses Final diagnoses:  None    Rx / DC Orders ED Discharge Orders    None       Veryl Speak, MD 11/14/19 [phone]

## 2019-12-04 DIAGNOSIS — Z3201 Encounter for pregnancy test, result positive: Secondary | ICD-10-CM | POA: Diagnosis not present

## 2019-12-12 ENCOUNTER — Other Ambulatory Visit: Payer: Self-pay

## 2019-12-12 ENCOUNTER — Encounter (HOSPITAL_COMMUNITY): Payer: Self-pay | Admitting: Family Medicine

## 2019-12-12 ENCOUNTER — Inpatient Hospital Stay (HOSPITAL_COMMUNITY): Payer: BC Managed Care – PPO

## 2019-12-12 ENCOUNTER — Inpatient Hospital Stay (HOSPITAL_COMMUNITY)
Admission: AD | Admit: 2019-12-12 | Discharge: 2019-12-12 | Disposition: A | Discharge status: Home / Self Care | Payer: BC Managed Care – PPO | Attending: Family Medicine | Admitting: Family Medicine

## 2019-12-12 DIAGNOSIS — O26851 Spotting complicating pregnancy, first trimester: Secondary | ICD-10-CM | POA: Diagnosis not present

## 2019-12-12 DIAGNOSIS — K5900 Constipation, unspecified: Secondary | ICD-10-CM | POA: Insufficient documentation

## 2019-12-12 DIAGNOSIS — O021 Missed abortion: Secondary | ICD-10-CM | POA: Diagnosis not present

## 2019-12-12 DIAGNOSIS — Z3A09 9 weeks gestation of pregnancy: Secondary | ICD-10-CM | POA: Diagnosis not present

## 2019-12-12 DIAGNOSIS — O99611 Diseases of the digestive system complicating pregnancy, first trimester: Secondary | ICD-10-CM | POA: Insufficient documentation

## 2019-12-12 DIAGNOSIS — O26891 Other specified pregnancy related conditions, first trimester: Secondary | ICD-10-CM | POA: Insufficient documentation

## 2019-12-12 DIAGNOSIS — R140 Abdominal distension (gaseous): Secondary | ICD-10-CM | POA: Diagnosis not present

## 2019-12-12 DIAGNOSIS — Z79899 Other long term (current) drug therapy: Secondary | ICD-10-CM | POA: Insufficient documentation

## 2019-12-12 DIAGNOSIS — R109 Unspecified abdominal pain: Secondary | ICD-10-CM | POA: Diagnosis not present

## 2019-12-12 DIAGNOSIS — O209 Hemorrhage in early pregnancy, unspecified: Secondary | ICD-10-CM | POA: Diagnosis not present

## 2019-12-12 DIAGNOSIS — Z6790 Unspecified blood type, Rh positive: Secondary | ICD-10-CM | POA: Insufficient documentation

## 2019-12-12 DIAGNOSIS — Z3A01 Less than 8 weeks gestation of pregnancy: Secondary | ICD-10-CM | POA: Diagnosis not present

## 2019-12-12 LAB — CBC
HCT: 35.8 % — ABNORMAL LOW (ref 36.0–46.0)
Hemoglobin: 11.8 g/dL — ABNORMAL LOW (ref 12.0–15.0)
MCH: 24.4 pg — ABNORMAL LOW (ref 26.0–34.0)
MCHC: 33 g/dL (ref 30.0–36.0)
MCV: 74 fL — ABNORMAL LOW (ref 80.0–100.0)
Platelets: 284 10*3/uL (ref 150–400)
RBC: 4.84 MIL/uL (ref 3.87–5.11)
RDW: 15.9 % — ABNORMAL HIGH (ref 11.5–15.5)
WBC: 6.8 10*3/uL (ref 4.0–10.5)
nRBC: 0 % (ref 0.0–0.2)

## 2019-12-12 LAB — URINALYSIS, ROUTINE W REFLEX MICROSCOPIC
Bilirubin Urine: NEGATIVE
Glucose, UA: NEGATIVE mg/dL
Hgb urine dipstick: NEGATIVE
Ketones, ur: NEGATIVE mg/dL
Leukocytes,Ua: NEGATIVE
Nitrite: NEGATIVE
Protein, ur: NEGATIVE mg/dL
Specific Gravity, Urine: 1.015 (ref 1.005–1.030)
pH: 8 (ref 5.0–8.0)

## 2019-12-12 LAB — WET PREP, GENITAL
Sperm: NONE SEEN
Trich, Wet Prep: NONE SEEN
Yeast Wet Prep HPF POC: NONE SEEN

## 2019-12-12 MED ORDER — IBUPROFEN 600 MG PO TABS: 600.0000 mg | ORAL_TABLET | Freq: Four times a day (QID) | ORAL | 0 refills | Status: DC | PRN

## 2019-12-12 MED ORDER — MISOPROSTOL 200 MCG PO TABS: 800.0000 ug | ORAL_TABLET | Freq: Once | ORAL | 0 refills | Status: DC

## 2019-12-12 MED ORDER — PROMETHAZINE HCL 25 MG PO TABS: 12.5000 mg | ORAL_TABLET | Freq: Four times a day (QID) | ORAL | 0 refills | Status: AC | PRN

## 2019-12-12 MED ORDER — OXYCODONE-ACETAMINOPHEN 5-325 MG PO TABS: 1.0000 | ORAL_TABLET | ORAL | 0 refills | Status: AC | PRN

## 2019-12-12 NOTE — Discharge Instructions (Signed)
Miscarriage A miscarriage is the loss of an unborn baby (fetus) before the 20th week of pregnancy. Follow these instructions at home: Medicines   Take over-the-counter and prescription medicines only as told by your doctor.  If you were prescribed antibiotic medicine, take it as told by your doctor. Do not stop taking the antibiotic even if you start to feel better.  Do not take NSAIDs unless your doctor says that this is safe for you. NSAIDs include aspirin and ibuprofen. These medicines can cause bleeding. Activity  Rest as directed. Ask your doctor what activities are safe for you.  Have someone help you at home during this time. General instructions  Write down how many pads you use each day and how soaked they are.  Watch the amount of tissue or clumps of blood (blood clots) that you pass from your vagina. Save any large amounts of tissue for your doctor.  Do not use tampons, douche, or have sex until your doctor approves.  To help you and your partner with the process of grieving, talk with your doctor or seek counseling.  When you are ready, meet with your doctor to talk about steps you should take for your health. Also, talk with your doctor about steps to take to have a healthy pregnancy in the future.  Keep all follow-up visits as told by your doctor. This is important. Contact a doctor if:  You have a fever or chills.  You have vaginal discharge that smells bad.  You have more bleeding. Get help right away if:  You have very bad cramps or pain in your back or belly.  You pass clumps of blood that are walnut-sized or larger from your vagina.  You pass tissue that is walnut-sized or larger from your vagina.  You soak more than 1 regular pad in an hour.  You get light-headed or weak.  You faint (pass out).  You have feelings of sadness that do not go away, or you have thoughts of hurting yourself. Summary  A miscarriage is the loss of an unborn baby before  the 20th week of pregnancy.  Follow your doctor's instructions for home care. Keep all follow-up appointments.  To help you and your partner with the process of grieving, talk with your doctor or seek counseling. This information is not intended to replace advice given to you by your health care provider. Make sure you discuss any questions you have with your health care provider. Document Revised: 02/27/2019 Document Reviewed: 12/11/2016 Elsevier Patient Education  [place]  WHAT IS AN EARLY PREGNANCY FAILURE? Once the egg is fertilized with the sperm and begins to develop, it attaches to the lining of the uterus. This early pregnancy tissue may not develop into an embryo (the beginning stage of a baby). Sometimes an embryo does develop but does not continue to grow. These problems can be seen on ultrasound.   MANAGEMNT OF EARLY PREGNANCY FAILURE: About 4 out of 100 (0.25%) women will have a pregnancy loss in her lifetime.  One in five pregnancies is found to be an early pregnancy failure.  There are 3 ways to care for an early pregnancy failure:   (1) Surgery, (2) Medicine, (3) Waiting for you to pass the pregnancy on your own. The decision as to how to proceed after being diagnosed with and early pregnancy failure is an individual one.  The decision can be made only after appropriate counseling.  You need to weigh  the pros and cons of the 3 choices. Then you can make the choice that works for you. SURGERY (D&E) . Procedure over in 1 day . Requires being put to sleep . Bleeding may be light . Possible problems during surgery, including injury to womb(uterus) . Care provider has more control Medicine (CYTOTEC) . The complete procedure may take days to weeks . No Surgery . Bleeding may be heavy at times . There may be drug side effects . Patient has more control Waiting . You may choose to wait, in which case your own body may complete the passing of  the abnormal early pregnancy on its own in about 2-4 weeks . Your bleeding may be heavy at times . There is a small possibility that you may need surgery if the bleeding is too much or not all of the pregnancy has passed. CYTOTEC MANAGEMENT Prostaglandins (cytotec) are the most widely used drug for this purpose. They cause the uterus to cramp and contract. You will place the medicine yourself inside your vagina in the privacy of your home. Empting of the uterus should occur within 3 days but the process may continue for several weeks. The bleeding may seem heavy at times. POSSIBLE SIDE EFFECTS FROM CYTOTEC . Nausea   Vomiting . Diarrhea Fever . Chills  Hot Flashes Side effects  from the process of the early pregnancy failure include: . Cramping  Bleeding . Headaches  Dizziness RISKS: This is a low risk procedure. Less than 1 in 100 women has a complication. An incomplete passage of the early pregnancy may occur. Also, Hemorrhage (heavy bleeding) could happen.  Rarely the pregnancy will not be passed completely. Excessively heavy bleeding may occur.  Your doctor may need to perform surgery to empty the uterus (D&E). Afterwards: Everybody will feel differently after the early pregnancy completion. You may have soreness or cramps for a day or two. You may have soreness or cramps for day or two.  You may have light bleeding for up to 2 weeks. You may be as active as you feel like being. If you have any of the following problems you may call Maternity Admissions Unit at [phone]. . If you have pain that does not get better  with pain medication . Bleeding that soaks through 2 thick full-sized sanitary pads in an hour . Cramps that last longer than 2 days . Foul smelling discharge . Fever above 100.4 degrees F Even if you do not have any of these symptoms, you should have a follow-up exam to make sure you are healing properly. This appointment will be made for you before you leave the hospital.  Your next normal period will start again in 4-6 week after the loss. You can get pregnant soon after the loss, so use birth control right away. Finally: Make sure all your questions are answered before during and after any procedure. Follow up with medical care and family planning methods.

## 2019-12-12 NOTE — MAU Note (Signed)
Christina Gamble is a 34 y.o. at [redacted]w[redacted]d here in MAU reporting: has been spotting since Jan 2. States she had been covid positive so she wasn't able to come until now. States bleeding was heavier at the beginning of January- bleeding was dark with clots and bad cramps. States not she only sees light bleeding when she wipes and is cramping today.  Onset of complaint: ongoing  Pain score: 6/10  Vitals:   12/12/19 0746  BP: 101/62  Pulse: 66  Resp: 16  Temp: 98.3 F (36.8 C)  SpO2: 100%     Lab orders placed from triage: UA

## 2019-12-12 NOTE — MAU Provider Note (Signed)
History     CSN: [id number]  Arrival date and time: 12/12/19 4166   First Provider Initiated Contact with Patient 12/12/19 0809      Chief Complaint  Patient presents with  . Abdominal Pain  . Vaginal Bleeding   34 y.o. G2P1001 @[redacted]w[redacted]d  with known IUP presenting with spotting and cramping. Spotting started 3 weeks ago. Has been intermittent and only when she wipes. Mostly pink. She has passed a few small clots no larger than a quarter. Reports 2-3 day hx of abd cramping. Feels bloated. Reports some constipation. No N/V/D. No urinary sx. No vaginal discharge, itching, or malodor. No fevers. She has not established prenatal care yet.   OB History    Gravida  2   Para  1   Term  1   Preterm      AB      Living  1     SAB      TAB      Ectopic      Multiple      Live Births  1           Past Medical History:  Diagnosis Date  . Anxiety     Past Surgical History:  Procedure Laterality Date  . FRACTURE SURGERY     arm    Family History  Problem Relation Age of Onset  . Heart attack Other   . Heart attack Maternal Grandmother     Social History   Tobacco Use  . Smoking status: Never Smoker  . Smokeless tobacco: Never Used  Substance Use Topics  . Alcohol use: Not Currently    Comment: occ  . Drug use: No    Allergies: No Known Allergies  No medications prior to admission.    Review of Systems  Constitutional: Negative for chills and fever.  Gastrointestinal: Positive for constipation. Negative for diarrhea, nausea and vomiting.  Genitourinary: Positive for vaginal bleeding. Negative for dysuria and vaginal discharge.   Physical Exam   Blood pressure 115/69, pulse 77, temperature 98.3 F (36.8 C), temperature source Oral, resp. rate 16, height 4\' 11"  (1.499 m), weight 58.6 kg, last menstrual period 10/04/2019, SpO2 100 %.  Physical Exam  Nursing note and vitals reviewed. Constitutional: She is oriented to person, place, and time. She  appears well-developed and well-nourished. No distress.  HENT:  Head: Normocephalic and atraumatic.  Cardiovascular: Normal rate.  Respiratory: Effort normal. No respiratory distress.  GI: Soft. She exhibits no distension and no mass. There is no abdominal tenderness. There is no rebound and no guarding.  Genitourinary:    Genitourinary Comments: External: no lesions or erythema Vagina: rugated, pink, moist, scant thin white discharge Uterus: non enlarged, anteverted, non tender, no CMT Adnexae: no masses, + tenderness left, + tenderness right Cervix closed/long    Musculoskeletal:        General: Normal range of motion.     Cervical back: Normal range of motion.  Neurological: She is alert and oriented to person, place, and time.  Skin: Skin is warm and dry.  Psychiatric: She has a normal mood and affect.  Bedside : no IUP identified  Results for orders placed or performed during the hospital encounter of 12/12/19 (from the past 24 hour(s))  Urinalysis, Routine w reflex microscopic     Status: Abnormal   Collection Time: 12/12/19  8:06 AM  Result Value Ref Range   Color, Urine STRAW (A) YELLOW   APPearance CLEAR CLEAR   Specific Gravity, Urine  1.015 1.005 - 1.030   pH 8.0 5.0 - 8.0   Glucose, UA NEGATIVE NEGATIVE mg/dL   Hgb urine dipstick NEGATIVE NEGATIVE   Bilirubin Urine NEGATIVE NEGATIVE   Ketones, ur NEGATIVE NEGATIVE mg/dL   Protein, ur NEGATIVE NEGATIVE mg/dL   Nitrite NEGATIVE NEGATIVE   Leukocytes,Ua NEGATIVE NEGATIVE  Wet prep, genital     Status: Abnormal   Collection Time: 12/12/19  8:10 AM   Specimen: PATH Cytology Cervicovaginal Ancillary Only  Result Value Ref Range   Yeast Wet Prep HPF POC NONE SEEN NONE SEEN   Trich, Wet Prep NONE SEEN NONE SEEN   Clue Cells Wet Prep HPF POC PRESENT (A) NONE SEEN   WBC, Wet Prep HPF POC MANY (A) NONE SEEN   Sperm NONE SEEN   CBC     Status: Abnormal   Collection Time: 12/12/19 10:50 AM  Result Value Ref Range   WBC  6.8 4.0 - 10.5 K/uL   RBC 4.84 3.87 - 5.11 MIL/uL   Hemoglobin 11.8 (L) 12.0 - 15.0 g/dL   HCT 47.0 (L) 96.2 - 83.6 %   MCV 74.0 (L) 80.0 - 100.0 fL   MCH 24.4 (L) 26.0 - 34.0 pg   MCHC 33.0 30.0 - 36.0 g/dL   RDW 62.9 (H) 47.6 - 54.6 %   Platelets 284 150 - 400 K/uL   nRBC 0.0 0.0 - 0.2 %    US OB Transvaginal  Result Date: 12/12/2019 CLINICAL DATA:  Pregnant, vaginal bleeding EXAM: TRANSVAGINAL OB ULTRASOUND TECHNIQUE: Transvaginal ultrasound was performed for complete evaluation of the gestation as well as the maternal uterus, adnexal regions, and pelvic cul-de-sac. COMPARISON:  11/11/2019 FINDINGS: Intrauterine gestational sac: Equivocal gestational sac in the uterine fundus, measuring 4.7 mm (image 20) Yolk sac:  Not Visualized. Embryo:  Not Visualized. MSD: 4.7 mm   5 w   1 d Subchorionic hemorrhage:  None visualized. Maternal uterus/adnexae: Prior IUP is no longer visualized. Benign nabothian cysts. Bilateral ovaries are within normal limits. No free fluid. IMPRESSION: Prior IUP is no longer visualized. Equivocal gestational sac in the uterine fundus, measuring 5 weeks 1 day by mean sac diameter. No yolk sac or fetal pole is visualized. Correlate with current serum beta HCG. Consider follow-up pelvic ultrasound in 14 days to confirm viability, if clinically warranted. Electronically Signed   By: Charline Bills M.D.   On: 12/12/2019 09:34   MAU Course  Procedures  MDM Labs and formal US ordered and reviewed. MAB identified by [place]. Discussed findings with pt, condolences given. Discussed mngt options pt prefers medical mngt.  Early Intrauterine Pregnancy Failure Protocol X  Documented intrauterine pregnancy failure less than or equal to [redacted] weeks gestation  X  No serious current illness  X  Baseline Hgb greater than or equal to 10g/dl  X  Patient has easily accessible transportation to the hospital  X  Clear preference  X  Practitioner/physician deems patient reliable  X  Counseling  by practitioner or physician  X  Patient education by RN       Rho-Gam given by RN if indicated  X  Medication Rx  X  Cytotec 800 mcg        Intravaginally by provider in MAU       Rectally by patient at home       Rectally by RN in MAU  __ Intravaginally by patient at home X   Ibuprofen 600 mg 1 tablet by mouth every 6 hours as needed -  prescribed  X   Percocet 5/325 1-2 tabs every 6 hours as needed - prescribed  X   Phenergan 25 mg by mouth every 6 hours as needed for nausea - prescribed  Reviewed with pt cytotec procedure. Pt verbalizes that she lives close to the hospital and has transportation readily available.  Pt appears reliable and verbalizes understanding and agrees with plan of care. Stable for discharge home.   Assessment and Plan   1. Missed abortion   2. Blood type, Rh positive    Discharge home Follow up at [facility] in 1 week- message sent Bleeding/return precautions  Allergies as of 12/12/2019   No Known Allergies     Medication List    STOP taking these medications   predniSONE 10 MG tablet Commonly known as: DELTASONE     TAKE these medications   famotidine 20 MG tablet Commonly known as: PEPCID Take 1 tablet (20 mg total) by mouth 2 (two) times daily.   ibuprofen 600 MG tablet Commonly known as: ADVIL Take 1 tablet (600 mg total) by mouth every 6 (six) hours as needed for cramping.   misoprostol 200 MCG tablet Commonly known as: Cytotec Take 4 tablets (800 mcg total) by mouth once for 1 dose.   oxyCODONE-acetaminophen 5-325 MG tablet Commonly known as: PERCOCET/ROXICET Take 1 tablet by mouth every 4 (four) hours as needed for severe pain.   promethazine 25 MG tablet Commonly known as: PHENERGAN Take 0.5-1 tablets (12.5-25 mg total) by mouth every 6 (six) hours as needed for nausea or vomiting.      Julianne Handler, CNM 12/12/2019, 12:59 PM

## 2019-12-14 LAB — GC/CHLAMYDIA PROBE AMP (~~LOC~~) NOT AT ARMC
Chlamydia: NEGATIVE
Comment: NEGATIVE
Comment: NORMAL
Neisseria Gonorrhea: NEGATIVE

## 2019-12-18 ENCOUNTER — Telehealth: Payer: Self-pay

## 2019-12-18 NOTE — Telephone Encounter (Signed)
Called pt to schedule a SAB f/u appt.Pt states she is at work and asked that I call back at 10 am during her break. I will call the pt later.  Jaequan Propes l Wyndi Northrup, CMA

## 2019-12-21 NOTE — Telephone Encounter (Signed)
Pt is scheduled for an OV on 12/25/2019 at 11 am.

## 2019-12-23 DIAGNOSIS — F419 Anxiety disorder, unspecified: Secondary | ICD-10-CM | POA: Diagnosis not present

## 2019-12-23 DIAGNOSIS — R05 Cough: Secondary | ICD-10-CM | POA: Diagnosis not present

## 2019-12-23 DIAGNOSIS — E559 Vitamin D deficiency, unspecified: Secondary | ICD-10-CM | POA: Diagnosis not present

## 2019-12-23 DIAGNOSIS — U071 COVID-19: Secondary | ICD-10-CM | POA: Diagnosis not present

## 2019-12-25 ENCOUNTER — Ambulatory Visit: Payer: BC Managed Care – PPO | Admitting: Family Medicine

## 2020-01-11 DIAGNOSIS — E559 Vitamin D deficiency, unspecified: Secondary | ICD-10-CM | POA: Diagnosis not present

## 2020-01-11 DIAGNOSIS — B948 Sequelae of other specified infectious and parasitic diseases: Secondary | ICD-10-CM | POA: Diagnosis not present

## 2020-01-11 DIAGNOSIS — F419 Anxiety disorder, unspecified: Secondary | ICD-10-CM | POA: Diagnosis not present

## 2020-01-11 DIAGNOSIS — R5383 Other fatigue: Secondary | ICD-10-CM | POA: Diagnosis not present

## 2020-01-15 DIAGNOSIS — Z114 Encounter for screening for human immunodeficiency virus [HIV]: Secondary | ICD-10-CM | POA: Diagnosis not present

## 2020-01-15 DIAGNOSIS — Z113 Encounter for screening for infections with a predominantly sexual mode of transmission: Secondary | ICD-10-CM | POA: Diagnosis not present

## 2020-01-15 DIAGNOSIS — Z30430 Encounter for insertion of intrauterine contraceptive device: Secondary | ICD-10-CM | POA: Diagnosis not present

## 2020-01-15 DIAGNOSIS — Z3202 Encounter for pregnancy test, result negative: Secondary | ICD-10-CM | POA: Diagnosis not present

## 2020-02-10 DIAGNOSIS — E559 Vitamin D deficiency, unspecified: Secondary | ICD-10-CM | POA: Diagnosis not present

## 2020-02-10 DIAGNOSIS — Z1389 Encounter for screening for other disorder: Secondary | ICD-10-CM | POA: Diagnosis not present

## 2020-02-10 DIAGNOSIS — Z131 Encounter for screening for diabetes mellitus: Secondary | ICD-10-CM | POA: Diagnosis not present

## 2020-02-10 DIAGNOSIS — Z0001 Encounter for general adult medical examination with abnormal findings: Secondary | ICD-10-CM | POA: Diagnosis not present

## 2020-02-10 DIAGNOSIS — B948 Sequelae of other specified infectious and parasitic diseases: Secondary | ICD-10-CM | POA: Diagnosis not present

## 2020-02-10 DIAGNOSIS — Z136 Encounter for screening for cardiovascular disorders: Secondary | ICD-10-CM | POA: Diagnosis not present

## 2020-02-10 DIAGNOSIS — Z1329 Encounter for screening for other suspected endocrine disorder: Secondary | ICD-10-CM | POA: Diagnosis not present

## 2020-02-10 DIAGNOSIS — E663 Overweight: Secondary | ICD-10-CM | POA: Diagnosis not present

## 2020-04-20 DIAGNOSIS — E663 Overweight: Secondary | ICD-10-CM | POA: Diagnosis not present

## 2020-04-20 DIAGNOSIS — F418 Other specified anxiety disorders: Secondary | ICD-10-CM | POA: Diagnosis not present

## 2020-04-20 DIAGNOSIS — E559 Vitamin D deficiency, unspecified: Secondary | ICD-10-CM | POA: Diagnosis not present

## 2020-04-27 DIAGNOSIS — R946 Abnormal results of thyroid function studies: Secondary | ICD-10-CM | POA: Diagnosis not present

## 2020-05-26 DIAGNOSIS — E559 Vitamin D deficiency, unspecified: Secondary | ICD-10-CM | POA: Diagnosis not present

## 2020-05-26 DIAGNOSIS — Z1329 Encounter for screening for other suspected endocrine disorder: Secondary | ICD-10-CM | POA: Diagnosis not present

## 2020-05-26 DIAGNOSIS — Z6826 Body mass index (BMI) 26.0-26.9, adult: Secondary | ICD-10-CM | POA: Diagnosis not present

## 2020-06-12 ENCOUNTER — Emergency Department (HOSPITAL_BASED_OUTPATIENT_CLINIC_OR_DEPARTMENT_OTHER)
Admission: EM | Admit: 2020-06-12 | Discharge: 2020-06-12 | Disposition: A | Discharge status: Home / Self Care | Payer: Medicaid Other | Attending: Emergency Medicine | Admitting: Emergency Medicine

## 2020-06-12 ENCOUNTER — Emergency Department (HOSPITAL_BASED_OUTPATIENT_CLINIC_OR_DEPARTMENT_OTHER): Payer: Medicaid Other

## 2020-06-12 ENCOUNTER — Other Ambulatory Visit: Payer: Self-pay

## 2020-06-12 ENCOUNTER — Encounter (HOSPITAL_BASED_OUTPATIENT_CLINIC_OR_DEPARTMENT_OTHER): Payer: Self-pay | Admitting: Emergency Medicine

## 2020-06-12 DIAGNOSIS — W108XXA Fall (on) (from) other stairs and steps, initial encounter: Secondary | ICD-10-CM | POA: Diagnosis not present

## 2020-06-12 DIAGNOSIS — Y939 Activity, unspecified: Secondary | ICD-10-CM | POA: Diagnosis not present

## 2020-06-12 DIAGNOSIS — M549 Dorsalgia, unspecified: Secondary | ICD-10-CM | POA: Diagnosis not present

## 2020-06-12 DIAGNOSIS — S79911A Unspecified injury of right hip, initial encounter: Secondary | ICD-10-CM | POA: Diagnosis not present

## 2020-06-12 DIAGNOSIS — Y929 Unspecified place or not applicable: Secondary | ICD-10-CM | POA: Diagnosis not present

## 2020-06-12 DIAGNOSIS — Y999 Unspecified external cause status: Secondary | ICD-10-CM | POA: Diagnosis not present

## 2020-06-12 DIAGNOSIS — S73101A Unspecified sprain of right hip, initial encounter: Secondary | ICD-10-CM | POA: Diagnosis not present

## 2020-06-12 DIAGNOSIS — M7918 Myalgia, other site: Secondary | ICD-10-CM | POA: Diagnosis not present

## 2020-06-12 DIAGNOSIS — M545 Low back pain: Secondary | ICD-10-CM | POA: Diagnosis not present

## 2020-06-12 DIAGNOSIS — W19XXXA Unspecified fall, initial encounter: Secondary | ICD-10-CM

## 2020-06-12 MED ORDER — MELOXICAM 7.5 MG PO TABS: 7.5000 mg | ORAL_TABLET | Freq: Every day | ORAL | 0 refills | Status: AC

## 2020-06-12 MED ORDER — IBUPROFEN 800 MG PO TABS
800.0000 mg | ORAL_TABLET | Freq: Once | ORAL | Status: AC
  Administered 2020-06-12: 800 mg via ORAL
  Filled 2020-06-12: qty 1

## 2020-06-12 NOTE — ED Provider Notes (Signed)
[facility] EMERGENCY DEPARTMENT Provider Note   CSN: [id number] Arrival date & time: 06/12/20  1619     History Chief Complaint  Patient presents with  . Hip Pain  . Fall    Christina Gamble is a 34 y.o. female.  34 year old female with complaint of pain in her right hip and low back.  Patient states that she tripped and fell down approximately 5 cement steps 6 hours ago, landing on her right side.  Patient has been ambulatory since the fall with pain, has not taken anything for her pain.  Pain is worse with any movement of her right hip.  Did not hit head, no loss of consciousness, no injury to neck or upper extremities, no other complaints or concerns.        Past Medical History:  Diagnosis Date  . Anxiety     There are no problems to display for this patient.   Past Surgical History:  Procedure Laterality Date  . FRACTURE SURGERY     arm     OB History    Gravida  2   Para  1   Term  1   Preterm      AB      Living  1     SAB      TAB      Ectopic      Multiple      Live Births  1           Family History  Problem Relation Age of Onset  . Heart attack Other   . Heart attack Maternal Grandmother     Social History   Tobacco Use  . Smoking status: Never Smoker  . Smokeless tobacco: Never Used  Vaping Use  . Vaping Use: Never used  Substance Use Topics  . Alcohol use: Not Currently    Comment: occ  . Drug use: No    Home Medications Prior to Admission medications   Medication Sig Start Date End Date Taking? Authorizing Provider  famotidine (PEPCID) 20 MG tablet Take 1 tablet (20 mg total) by mouth 2 (two) times daily. 08/24/18   Law, Waylan Boga, PA-C  meloxicam (MOBIC) 7.5 MG tablet Take 1 tablet (7.5 mg total) by mouth daily. 06/12/20   Jeannie Fend, PA-C  oxyCODONE-acetaminophen (PERCOCET/ROXICET) 5-325 MG tablet Take 1 tablet by mouth every 4 (four) hours as needed for severe pain. 12/12/19   Donette Larry,  CNM  promethazine (PHENERGAN) 25 MG tablet Take 0.5-1 tablets (12.5-25 mg total) by mouth every 6 (six) hours as needed for nausea or vomiting. 12/12/19   Donette Larry, CNM  misoprostol (CYTOTEC) 200 MCG tablet Take 4 tablets (800 mcg total) by mouth once for 1 dose. 12/12/19 06/12/20  Donette Larry, CNM    Allergies    Patient has no known allergies.  Review of Systems   Review of Systems  Constitutional: Negative for fever.  Musculoskeletal: Positive for arthralgias, back pain and myalgias.  Skin: Negative for color change, rash and wound.  Allergic/Immunologic: Negative for immunocompromised state.  Neurological: Negative for weakness and numbness.  All other systems reviewed and are negative.   Physical Exam Updated Vital Signs BP (!) 94/64 (BP Location: Left Arm)   Pulse 100   Temp 98 F (36.7 C) (Oral)   Resp 20   Ht 4\' 11"  (1.499 m)   Wt 59.9 kg   LMP 10/07/2019 Comment: IUD last Spring  SpO2 100%   Breastfeeding Unknown  BMI 26.66 kg/m   Physical Exam Vitals and nursing note reviewed.  Constitutional:      General: She is not in acute distress.    Appearance: She is well-developed. She is not diaphoretic.  HENT:     Head: Normocephalic and atraumatic.  Cardiovascular:     Pulses: Normal pulses.  Pulmonary:     Effort: Pulmonary effort is normal.  Abdominal:     Palpations: Abdomen is soft.     Tenderness: There is no abdominal tenderness.  Musculoskeletal:        General: Tenderness present. No swelling or deformity.     Thoracic back: No tenderness or bony tenderness.     Lumbar back: No tenderness or bony tenderness.     Right lower leg: No edema.     Left lower leg: No edema.     Comments: Patient is able to cross and uncross legs without pain. Does have pain with passive range of motion to the right hip as well as with palpation over general right hip area (right iliac crest, anterior and lateral hip).  Skin:    General: Skin is warm and dry.      Findings: No erythema or rash.  Neurological:     Mental Status: She is alert and oriented to person, place, and time.  Psychiatric:        Behavior: Behavior normal.     ED Results / Procedures / Treatments   Labs (all labs ordered are listed, but only abnormal results are displayed) Labs Reviewed - No data to display  EKG None  Radiology DG Lumbar Spine Complete  Result Date: 06/12/2020 CLINICAL DATA:  34 year old female with history of trauma from a fall down a flight of stairs. Back pain. EXAM: LUMBAR SPINE - COMPLETE 4+ VIEW COMPARISON:  No priors. FINDINGS: There is no evidence of lumbar spine fracture. Alignment is normal. Intervertebral disc spaces are maintained. IUD projecting over the central pelvis. IMPRESSION: Negative. Electronically Signed   By: Trudie Reed M.D.   On: 06/12/2020 17:57   DG Hip Unilat  With Pelvis 2-3 Views Right  Result Date: 06/12/2020 CLINICAL DATA:  34 year old female with history of trauma after falling down a flight of stairs. Pain in the right hip. EXAM: DG HIP (WITH OR WITHOUT PELVIS) 2-3V RIGHT COMPARISON:  No priors. FINDINGS: There is no evidence of hip fracture or dislocation. There is no evidence of arthropathy or other focal bone abnormality. IUD projecting over the central pelvis. IMPRESSION: Negative. Electronically Signed   By: Trudie Reed M.D.   On: 06/12/2020 17:56    Procedures Procedures (including critical care time)  Medications Ordered in ED Medications  ibuprofen (ADVIL) tablet 800 mg (has no administration in time range)    ED Course  I have reviewed the triage vital signs and the nursing notes.  Pertinent labs & imaging results that were available during my care of the patient were reviewed by me and considered in my medical decision making (see chart for details).  Clinical Course as of Jun 12 1813  Sun Jun 12, 2020  [id number] 34 year old female with right hip pain after mechanical fall down steps today.  Patient does not have pain with active ROM of the hip, has pain with passive ROM on exam. Pelvis is stable but tender on the right. No L-spine tenderness.  XR hip and l-spine without acute bony abnormality.  Discussed results with patient, offered crutches which patient accepts, also rx for Meloxicam for pain, given  Motrin prior to dc. Plan is to follow up with sports medicine if pain is not improving.    [LM]    Clinical Course User Index [LM] Alden Hipp   MDM Rules/Calculators/A&P                          Final Clinical Impression(s) / ED Diagnoses Final diagnoses:  Fall, initial encounter  Hip sprain, right, initial encounter    Rx / DC Orders ED Discharge Orders         Ordered    meloxicam (MOBIC) 7.5 MG tablet  Daily     Discontinue  Reprint     06/12/20 1805           Jeannie Fend, PA-C 06/12/20 1814    Melene Plan, DO 06/12/20 1819

## 2020-06-12 NOTE — ED Triage Notes (Signed)
Patient states that she fell down 4 -5 stairs earlier this am  - the patient states that she is having right hip and lower back

## 2020-06-12 NOTE — Discharge Instructions (Signed)
Weight-bear as tolerated. Take meloxicam as needed as prescribed for pain.  You can also take Tylenol with this medication. Follow-up with sports medicine if not improving after a few days.

## 2021-08-28 IMAGING — US US OB < 14 WEEKS - US OB TV
1 series · 15 of 28 positions shown · non-contrast
Comparison: None

CLINICAL DATA: Abdominal pain in first trimester of pregnancy, 5
weeks 3 days EGA by LMP

EXAM:
OBSTETRIC <14 WK US AND TRANSVAGINAL OB US
TECHNIQUE: Both transabdominal and transvaginal ultrasound examinations were
performed for complete evaluation of the gestation as well as the
maternal uterus, adnexal regions, and pelvic cul-de-sac.
Transvaginal technique was performed to assess early pregnancy.

[Series 1: us ob < 14 weeks - us ob tv · 15 of 35 slices shown]
[im 1/35]
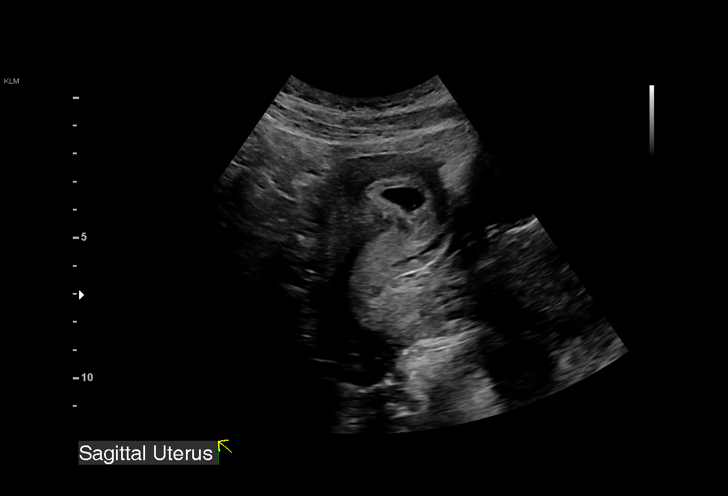
[im 3/35]
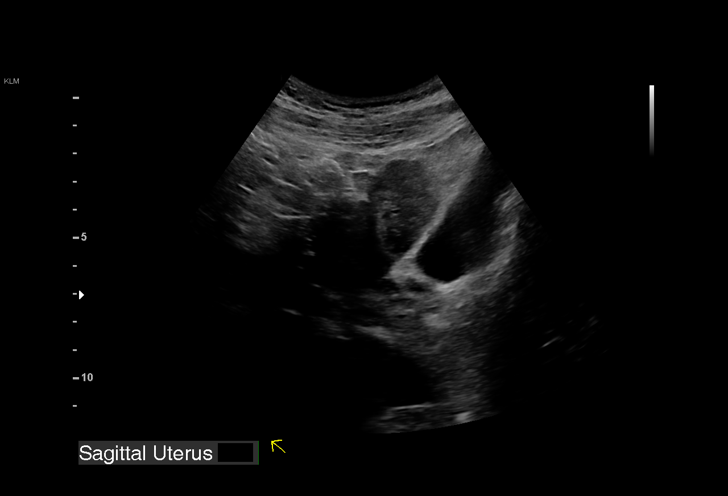
[im 6/35]
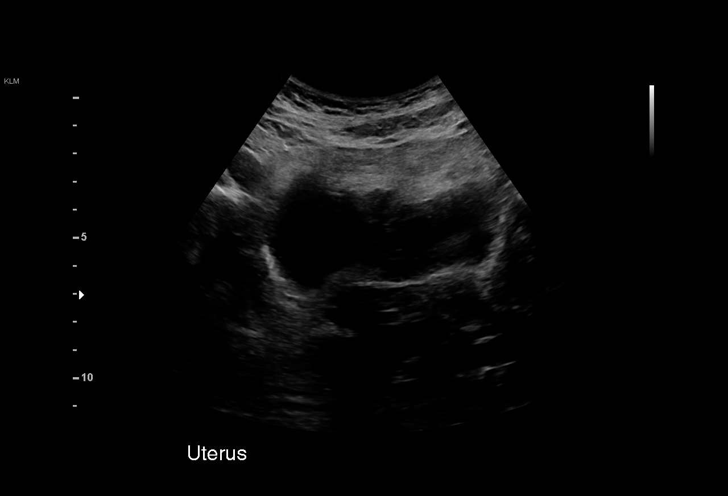
[im 8/35]
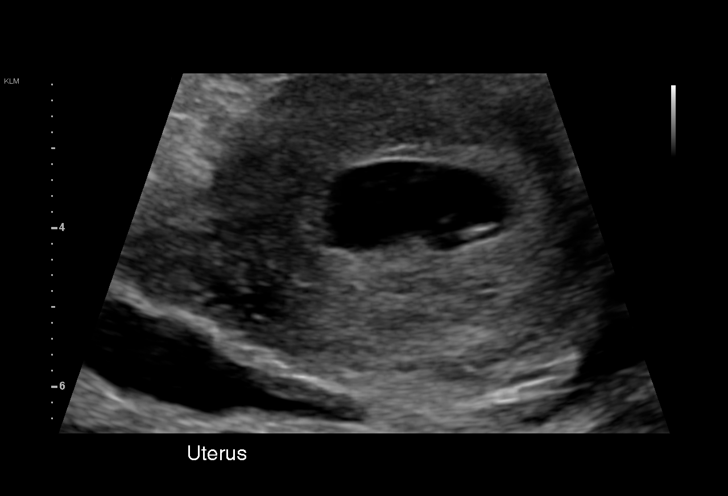
[im 11/35]
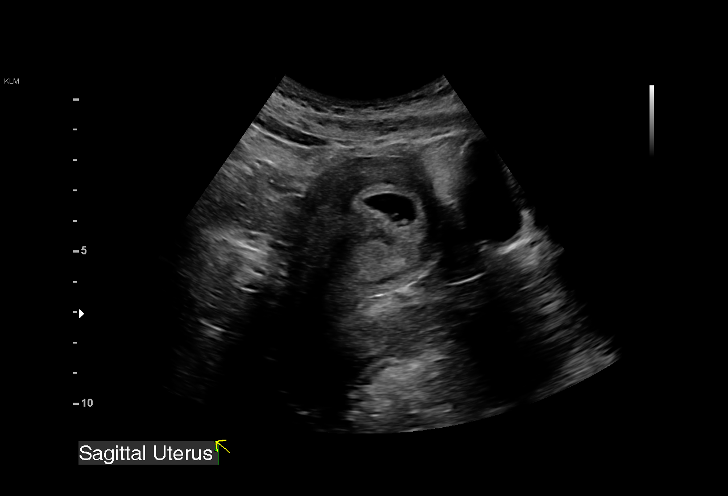
[im 13/35]
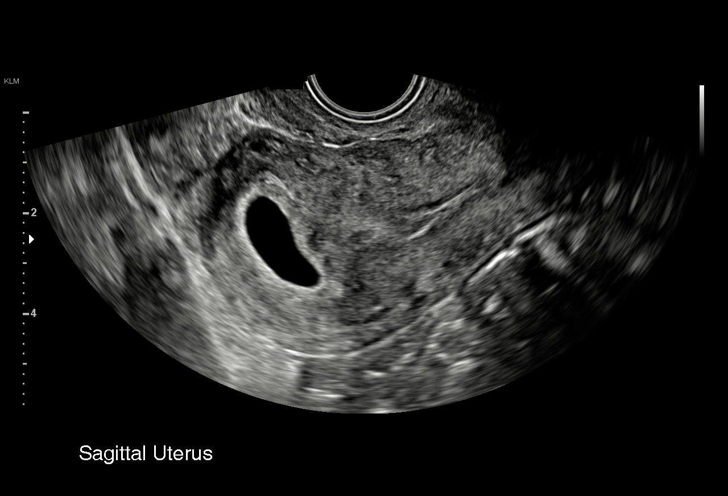
[im 16/35]
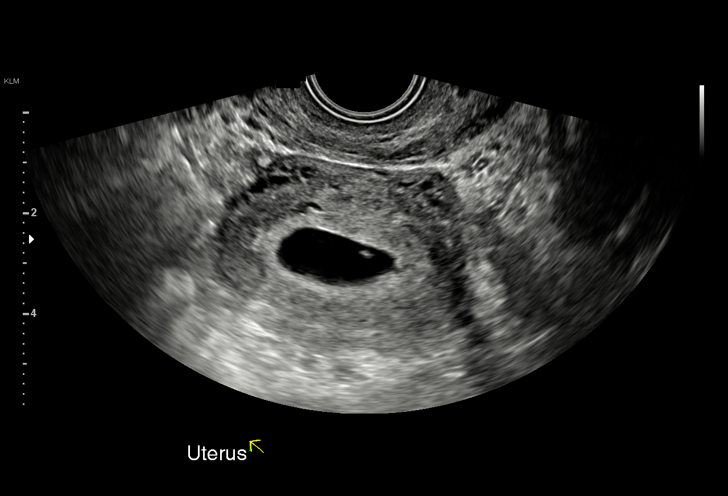
[im 18/35]
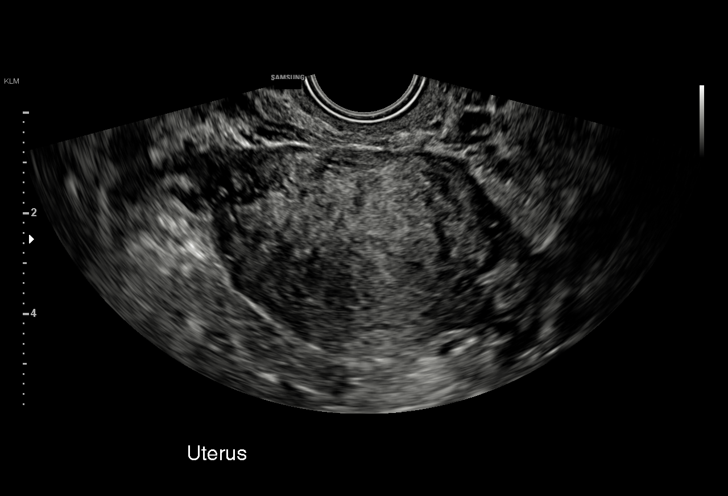
[im 19/35]
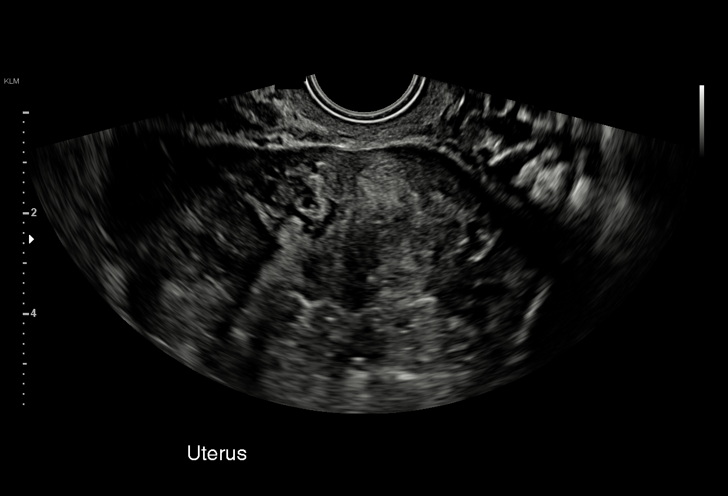
[im 22/35]
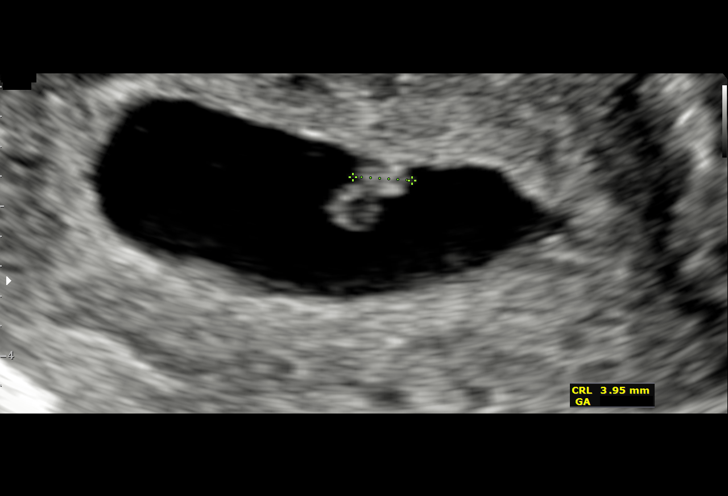
[im 24/35]
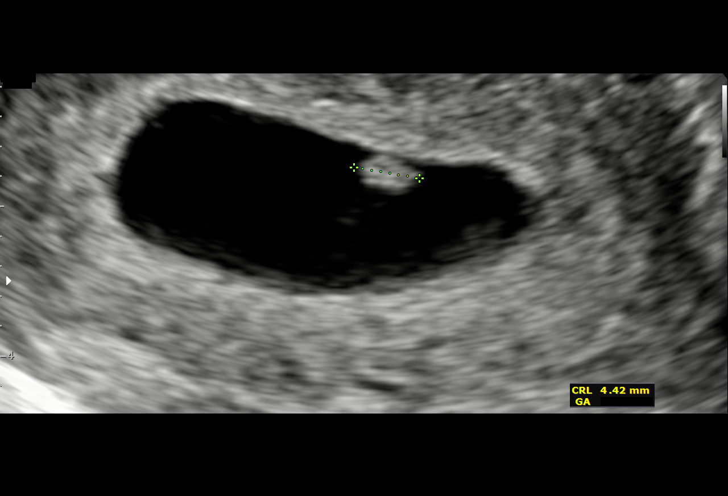
[im 27/35]
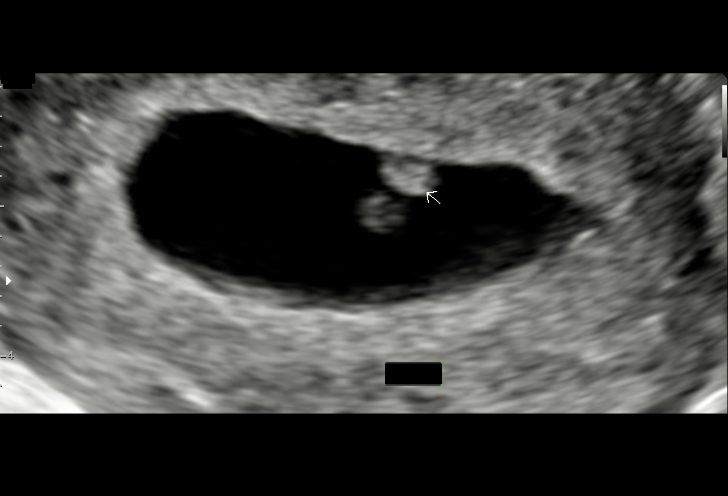
[im 29/35]
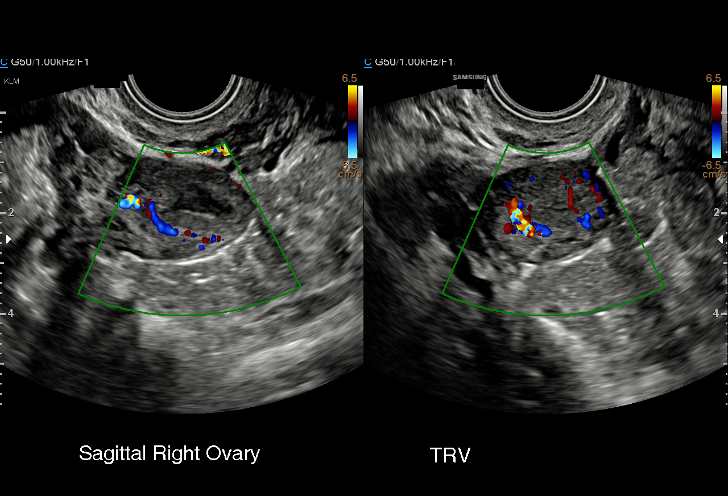
[im 32/35]
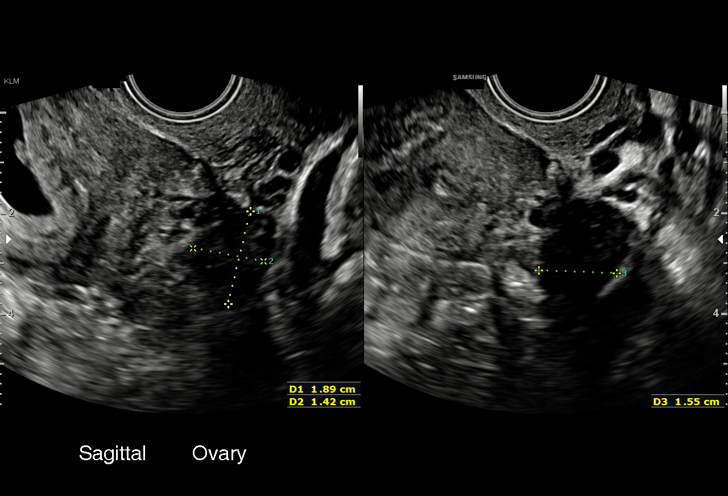
[im 35/35]
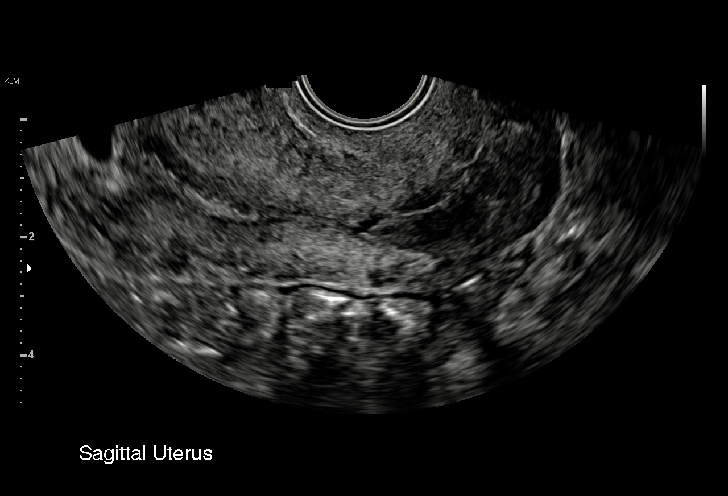

[15 of 28 positions shown; findings below may reference images not displayed]

FINDINGS: Intrauterine gestational sac: Present, single

Yolk sac:  Present

Embryo:  Present

Cardiac Activity: Present

Heart Rate: 112 bpm

CRL:  4.2 mm   6 w   1 d                  US EDC: 07/05/2020

Subchorionic hemorrhage:  None visualized.

Maternal uterus/adnexae:

RIGHT ovary measures 3.2 x 1.8 x 2.4 cm and contains a small corpus
luteum.

LEFT ovary normal size and morphology 1.9 x 1.4 x 1.6 cm.

Uterus anteverted and otherwise normal appearance.

No free pelvic fluid or adnexal masses.
IMPRESSION: Single live intrauterine gestation at 6 weeks 1 day EGA by
crown-rump length.

No acute abnormalities.

## 2021-09-28 IMAGING — US US OB TRANSVAGINAL
1 series · 15 of 28 positions shown · non-contrast
Comparison: 11/11/2019

CLINICAL DATA: Pregnant, vaginal bleeding

EXAM:
TRANSVAGINAL OB ULTRASOUND
TECHNIQUE: Transvaginal ultrasound was performed for complete evaluation of the
gestation as well as the maternal uterus, adnexal regions, and
pelvic cul-de-sac.

[Series 1: us ob transvaginal · 35 acquisitions, 15 frames shown]
[im 1/35]
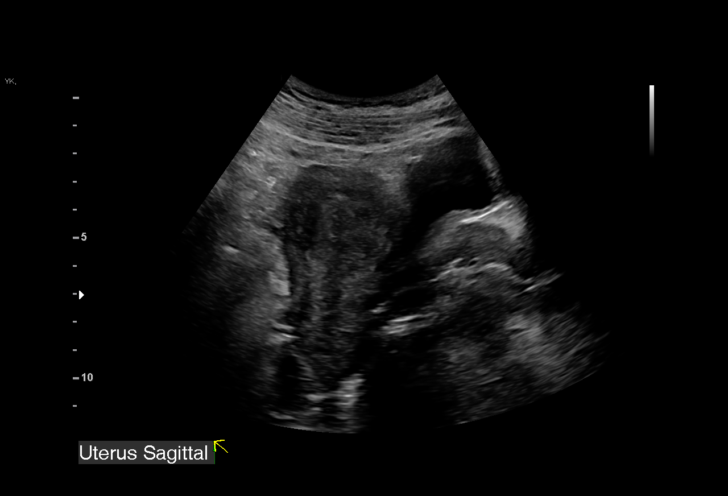
[im 3/35]
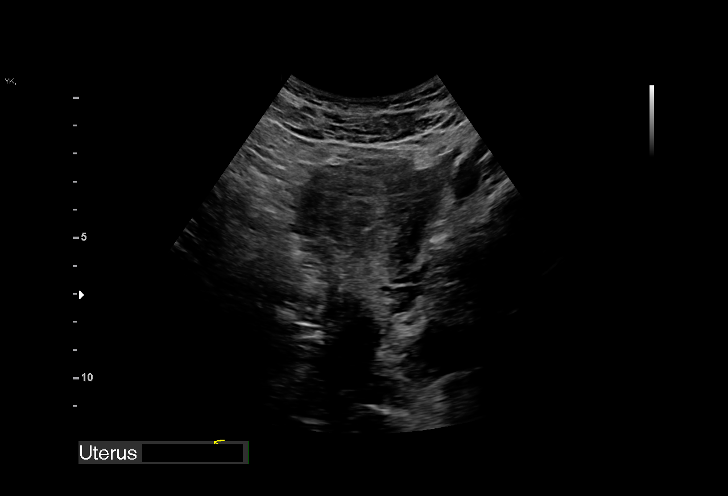
[im 6/35]
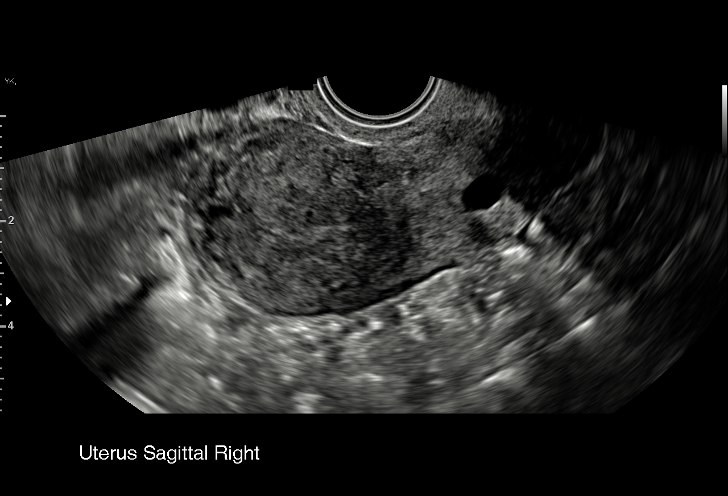
[im 8/35]
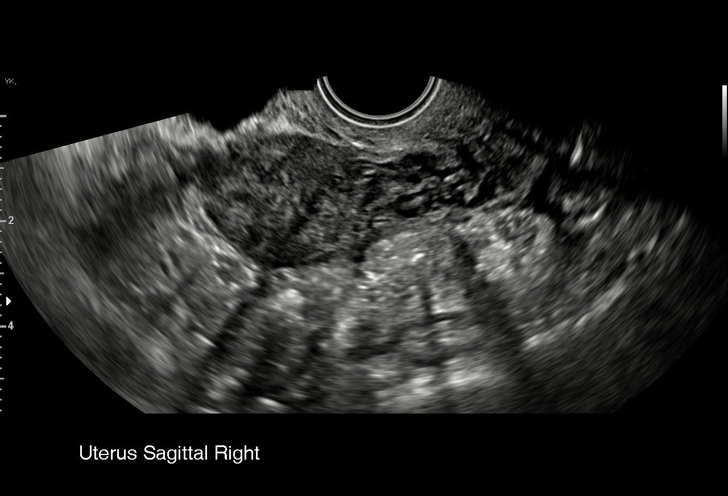
[im 11/35]
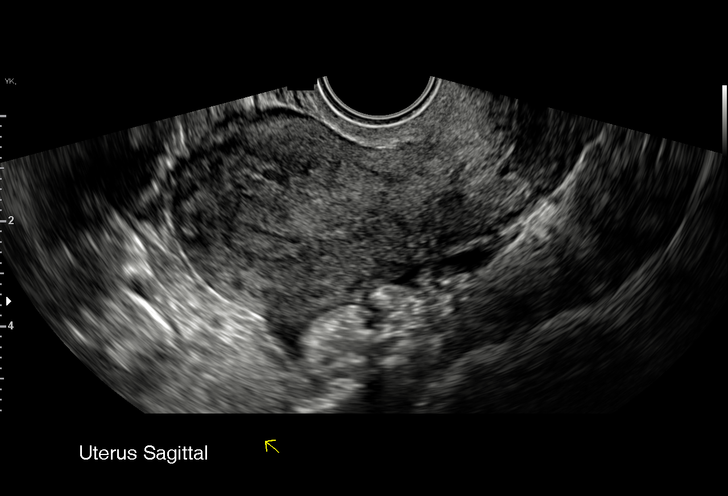
[im 13/35]
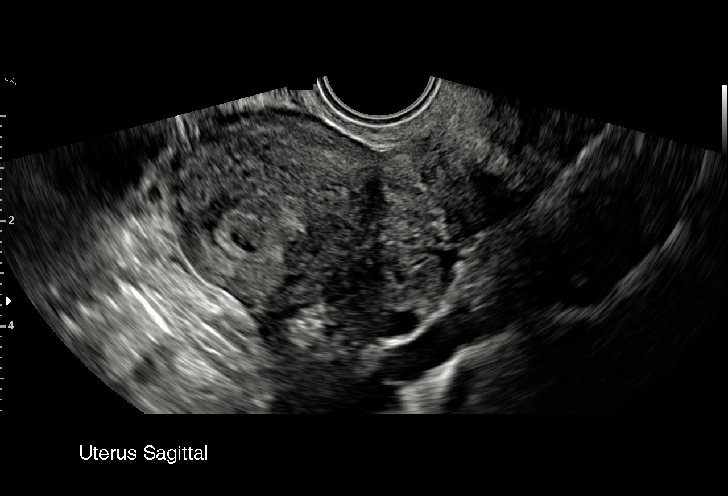
[im 16/35]
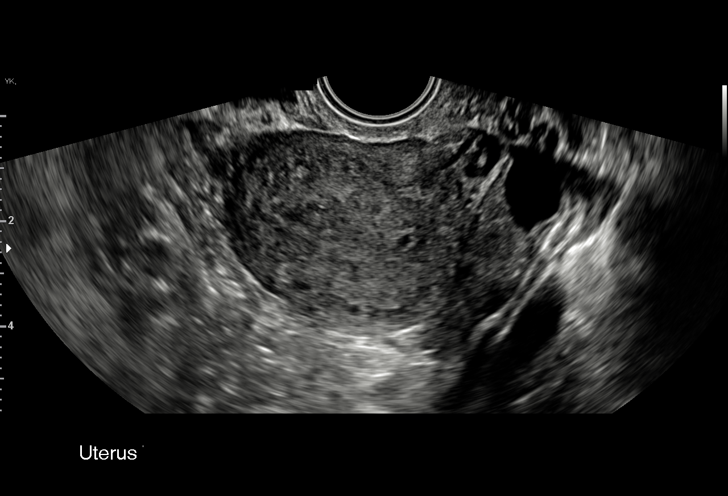
[im 18/35]
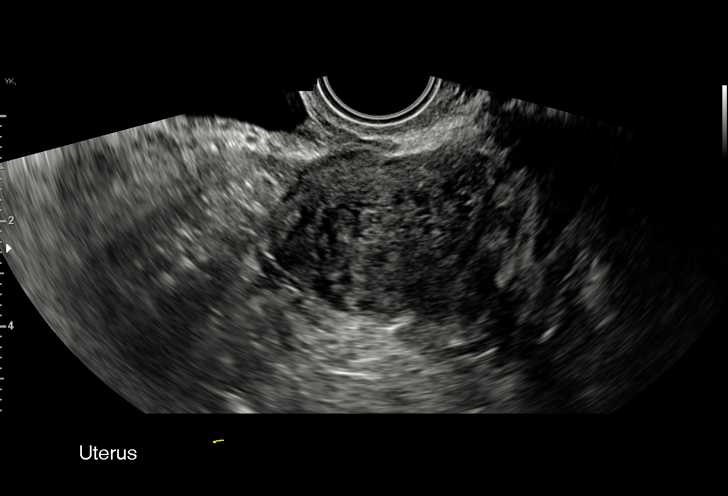
[im 19/35]
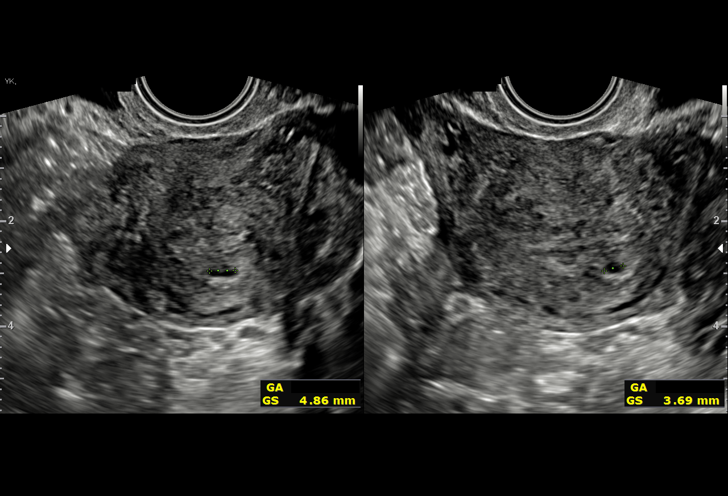
[im 22/35]
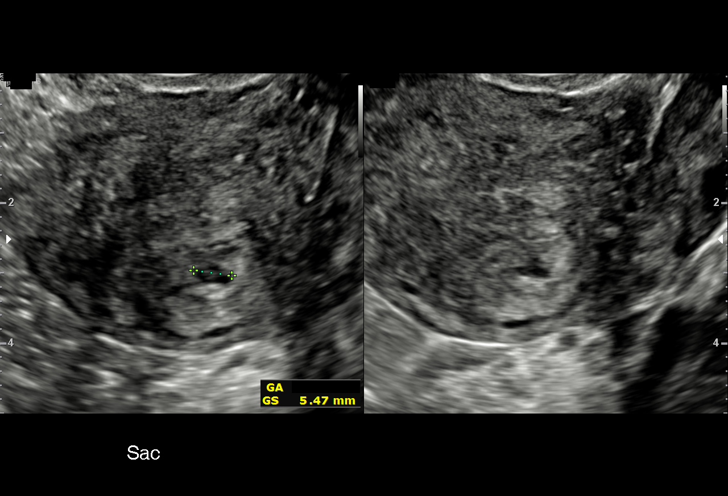
[im 24/35]
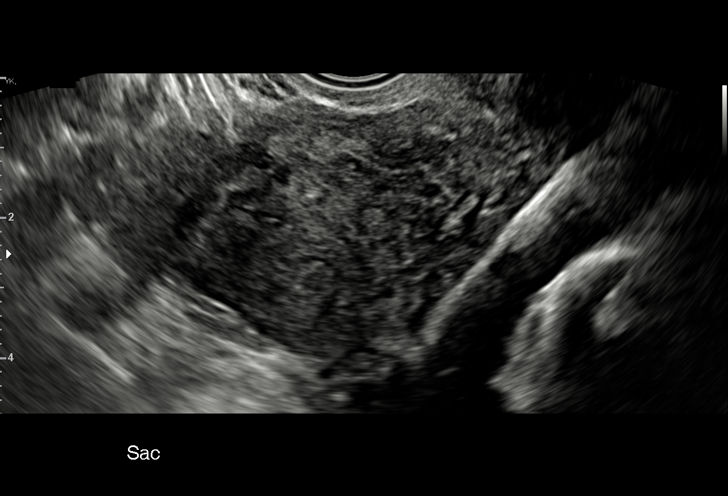
[im 27/35]
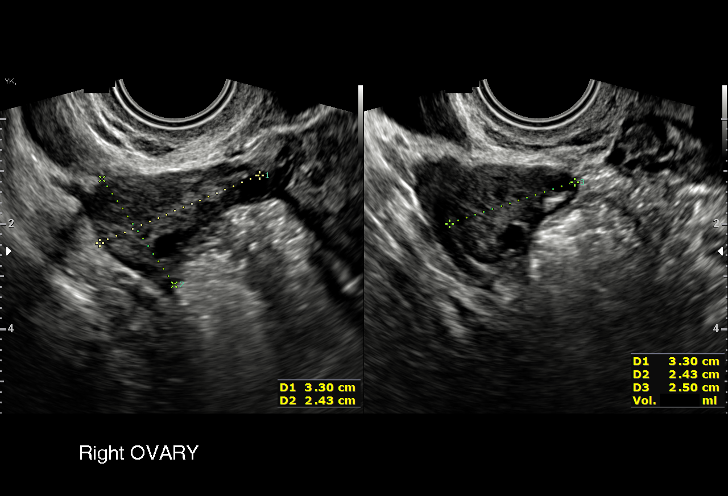
[im 29/35]
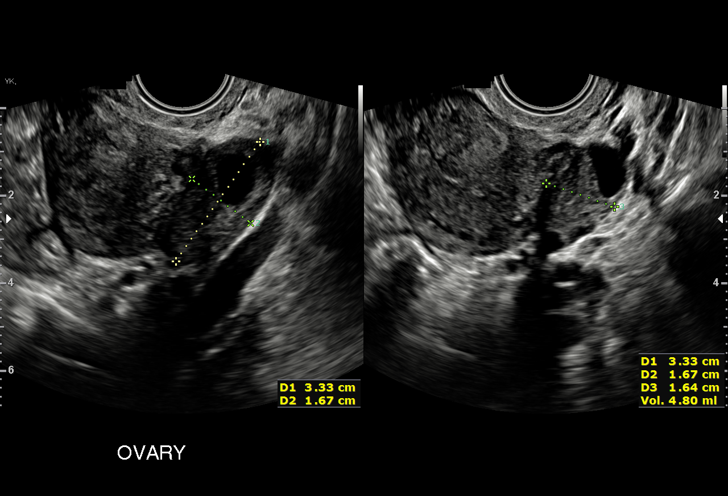
[im 32/35]
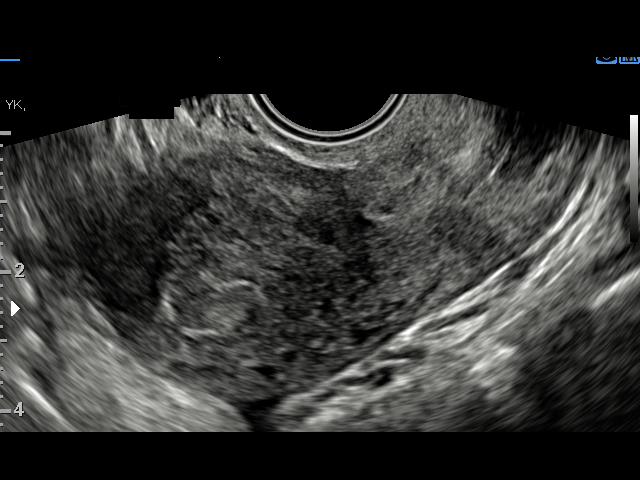
[im 35/35]
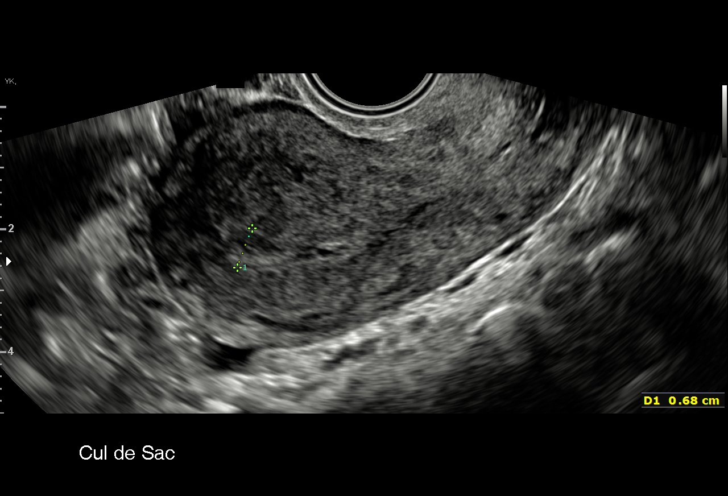

[15 of 28 positions shown; findings below may reference images not displayed]

FINDINGS: Intrauterine gestational sac: Equivocal gestational sac in the
uterine fundus, measuring 4.7 mm (image 20)

Yolk sac:  Not Visualized.

Embryo:  Not Visualized.

MSD: 4.7 mm   5 w   1 d

Subchorionic hemorrhage:  None visualized.

Maternal uterus/adnexae: Prior IUP is no longer visualized.

Benign nabothian cysts.

Bilateral ovaries are within normal limits.

No free fluid.
IMPRESSION: Prior IUP is no longer visualized.

Equivocal gestational sac in the uterine fundus, measuring 5 weeks 1
day by mean sac diameter. No yolk sac or fetal pole is visualized.

Correlate with current serum beta HCG. Consider follow-up pelvic
ultrasound in 14 days to confirm viability, if clinically warranted.
